# Patient Record
Sex: Female | Born: 1935 | Race: Asian | Hispanic: No | Marital: Married
Health system: Midwestern US, Community
[De-identification: ages and names within clinical notes are randomized; demographics above are authoritative.]

---

## 2017-05-05 ENCOUNTER — Emergency Department: Admit: 2017-05-06 | Payer: MEDICARE

## 2017-05-05 DIAGNOSIS — G8190 Hemiplegia, unspecified affecting unspecified side: Secondary | ICD-10-CM

## 2017-05-05 NOTE — ED Provider Notes (Signed)
HPI Comments: 81 year old female right sided facial droop and right-sided numbness.  Onset of symptoms 4 PM per family.   Patient's visiting the area for a family event and around 4:00 started showing a facial droop.    Patient is a 81 y.o. female presenting with stroke. The history is provided by the patient.   Stroke   This is a new problem. The current episode started 6 to 12 hours ago. The problem has not changed since onset.There was no focality noted. Pertinent negatives include no focal weakness, no slurred speech and no mental status change. There has been no fever. Pertinent negatives include no shortness of breath, no altered mental status, no confusion, no headaches, no choking, no nausea and no bladder incontinence. There were no medications administered prior to arrival. Associated medical issues do not include trauma.        Past Medical History:   Diagnosis Date   ??? Diabetes (HCC)    ??? Hypertension        No past surgical history on file.      No family history on file.    Social History     Social History   ??? Marital status: N/A     Spouse name: N/A   ??? Number of children: N/A   ??? Years of education: N/A     Occupational History   ??? Not on file.     Social History Main Topics   ??? Smoking status: Never Smoker   ??? Smokeless tobacco: Never Used   ??? Alcohol use No   ??? Drug use: Not on file   ??? Sexual activity: Not on file     Other Topics Concern   ??? Not on file     Social History Narrative   ??? No narrative on file         ALLERGIES: Review of patient's allergies indicates no known allergies.    Review of Systems   Constitutional: Negative.  Negative for activity change.   HENT: Negative.    Eyes: Negative.    Respiratory: Negative.  Negative for choking and shortness of breath.    Cardiovascular: Negative.    Gastrointestinal: Negative.  Negative for nausea.   Genitourinary: Negative.  Negative for bladder incontinence.   Musculoskeletal: Negative.    Skin: Negative.     Neurological: Negative for focal weakness and headaches.   Psychiatric/Behavioral: Negative.  Negative for confusion.   All other systems reviewed and are negative.      Vitals:    05/05/17 2316   BP: 162/67   Pulse: 72   Resp: 18   Temp: 97.9 ??F (36.6 ??C)   SpO2: 100%   Weight: 49.9 kg (110 lb)   Height: 5\' 1"  (1.549 m)            Physical Exam   Constitutional: She is oriented to person, place, and time. She appears well-developed and well-nourished. No distress.   HENT:   Head: Normocephalic and atraumatic.   Right Ear: External ear normal.   Left Ear: External ear normal.   Nose: Nose normal.   Mouth/Throat: Oropharynx is clear and moist. No oropharyngeal exudate.   Eyes: Conjunctivae and EOM are normal. Pupils are equal, round, and reactive to light. Right eye exhibits no discharge. Left eye exhibits no discharge. No scleral icterus.   Neck: Normal range of motion. Neck supple. No JVD present. No tracheal deviation present.   Cardiovascular: Normal rate, regular rhythm and intact distal pulses.  Pulmonary/Chest: Effort normal and breath sounds normal. No stridor. No respiratory distress. She has no wheezes. She exhibits no tenderness.   Abdominal: Soft. Bowel sounds are normal. She exhibits no distension and no mass. There is no tenderness.   Musculoskeletal: Normal range of motion. She exhibits no edema or tenderness.   Neurological: She is alert and oriented to person, place, and time. A cranial nerve deficit and sensory deficit is present. She exhibits abnormal muscle tone.   Skin: Skin is warm and dry. No rash noted. She is not diaphoretic. No erythema. No pallor.   Psychiatric: She has a normal mood and affect. Her behavior is normal. Thought content normal.   Nursing note and vitals reviewed.       MDM  Number of Diagnoses or Management Options  Hemiparesis, unspecified hemiparesis etiology, unspecified laterality [facility]):   Diagnosis management comments: Patient evaluated byTele-neurology  And  felt to be back to her baseline showed no signs of neuro deficits.  Long discussion with the family and they do not want further admission or testing they would take her back to [facility] and get testing done there.  Neurologist and the family are in agreement.       Amount and/or Complexity of Data Reviewed  Clinical lab tests: ordered and reviewed  Tests in the radiology section of CPT??: reviewed and ordered  Tests in the medicine section of CPT??: reviewed and ordered          ED Course       Procedures

## 2017-05-05 NOTE — ED Triage Notes (Signed)
Facial dropping last know normal was 1600

## 2017-05-06 ENCOUNTER — Inpatient Hospital Stay
Admit: 2017-05-06 | Discharge: 2017-05-06 | Disposition: A | Discharge status: Home / Self Care | Payer: MEDICARE | Attending: Emergency Medicine

## 2017-05-06 LAB — METABOLIC PANEL, COMPREHENSIVE
A-G Ratio: 0.9 — ABNORMAL LOW (ref 1.2–3.5)
ALT (SGPT): 16 U/L (ref 12–65)
AST (SGOT): 17 U/L (ref 15–37)
Albumin: 3.4 g/dL (ref 3.2–4.6)
Alk. phosphatase: 53 U/L (ref 50–136)
Anion gap: 9 mmol/L (ref 7–16)
BUN: 20 MG/DL (ref 8–23)
Bilirubin, total: 0.3 MG/DL (ref 0.2–1.1)
CO2: 30 mmol/L (ref 21–32)
Calcium: 9.1 MG/DL (ref 8.3–10.4)
Chloride: 104 mmol/L (ref 98–107)
Creatinine: 0.97 MG/DL (ref 0.6–1.0)
GFR est AA: >60 mL/min/{1.73_m2} (ref >60)
GFR est non-AA: 59 mL/min/{1.73_m2} — ABNORMAL LOW (ref >60)
Globulin: 3.6 g/dL — ABNORMAL HIGH (ref 2.3–3.5)
Glucose: 183 mg/dL — ABNORMAL HIGH (ref 65–100)
Potassium: 4.4 mmol/L (ref 3.5–5.1)
Protein, total: 7 g/dL (ref 6.3–8.2)
Sodium: 143 mmol/L (ref 136–145)

## 2017-05-06 LAB — CBC WITH AUTOMATED DIFF
ABS. BASOPHILS: 0 10*3/uL (ref 0.0–0.2)
ABS. EOSINOPHILS: 0.1 10*3/uL (ref 0.0–0.8)
ABS. IMM. GRANS.: 0 10*3/uL (ref 0.0–0.5)
ABS. LYMPHOCYTES: 1.3 10*3/uL (ref 0.5–4.6)
ABS. MONOCYTES: 0.5 10*3/uL (ref 0.1–1.3)
ABS. NEUTROPHILS: 3 10*3/uL (ref 1.7–8.2)
BASOPHILS: 0 % (ref 0.0–2.0)
EOSINOPHILS: 2 % (ref 0.5–7.8)
HCT: 41.4 % (ref 35.8–46.3)
HGB: 13.6 g/dL (ref 11.7–15.4)
IMMATURE GRANULOCYTES: 0 % (ref 0.0–5.0)
LYMPHOCYTES: 27 % (ref 13–44)
MCH: 27.1 PG (ref 26.1–32.9)
MCHC: 32.9 g/dL (ref 31.4–35.0)
MCV: 82.5 FL (ref 79.6–97.8)
MONOCYTES: 10 % (ref 4.0–12.0)
NEUTROPHILS: 61 % (ref 43–78)
PLATELET: 218 10*3/uL (ref 150–450)
RBC: 5.02 M/uL (ref 4.05–5.25)
RDW: 15.3 % — ABNORMAL HIGH (ref 11.9–14.6)
WBC: 4.9 10*3/uL (ref 4.3–11.1)

## 2017-05-06 LAB — MAGNESIUM: Magnesium: 2 mg/dL (ref 1.8–2.4)

## 2017-05-06 LAB — SED RATE, AUTOMATED: Sed rate, automated: 15 mm/hr (ref 0–30)

## 2017-05-06 LAB — POC LACTIC ACID: Lactic Acid (POC): 1.3 mmol/L (ref 0.5–1.9)

## 2017-05-06 NOTE — ED Notes (Signed)
I have reviewed discharge instructions with the daughter.  The daughter verbalized understanding.    Patient left ED via Discharge Method: ambulatory to Home with family   Opportunity for questions and clarification provided.       Patient given 0 scripts.         To continue your aftercare when you leave the hospital, you may receive an automated call from our care team to check in on how you are doing.  This is a free service and part of our promise to provide the best care and service to meet your aftercare needs.??? If you have questions, or wish to unsubscribe from this service please call [phone].  Thank you for Choosing our [facility].

## 2017-05-06 NOTE — ED Notes (Signed)
Redraw green top sent to lab. POC lactic running.

## 2017-05-07 LAB — EKG 12-LEAD
Atrial Rate: 62 {beats}/min
P Axis: 79 degrees
P-R Interval: 140 ms
Q-T Interval: 404 ms
QRS Duration: 72 ms
QTc Calculation (Bazett): 410 ms
R Axis: 61 degrees
T Axis: 75 degrees
Ventricular Rate: 62 {beats}/min

## 2017-05-07 LAB — EKG, 12 LEAD, INITIAL
Atrial Rate: 62 {beats}/min
Calculated P Axis: 79 degrees
Calculated R Axis: 61 degrees
Calculated T Axis: 75 degrees
P-R Interval: 140 ms
Q-T Interval: 404 ms
QRS Duration: 72 ms
QTC Calculation (Bezet): 410 ms
Ventricular Rate: 62 {beats}/min

## 2019-02-10 IMAGING — CT CT HEAD WO CONTRAST
1 series · 16 of 30 positions shown, 20 images · non-contrast
Comparison: none

Noncontrast CT head.
INDICATION: persistente headache x 3 days, Byars/Tr Nancy

[Series 2: head stnd · axial · 0.42mm/px · z∈[-2,+145]mm · 16 of 32 slices shown, 20 images]
[im 2/32  brain]
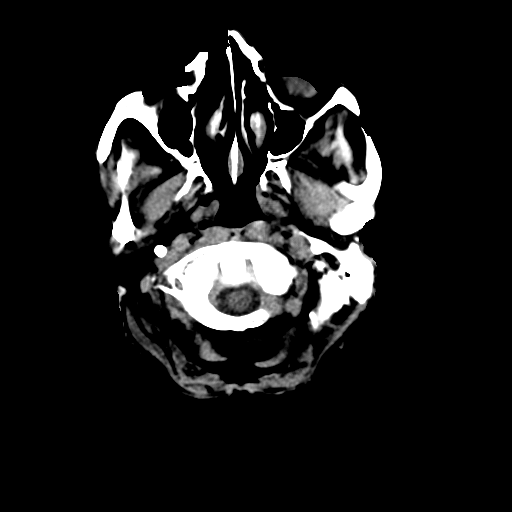
[im 2/32  bone]
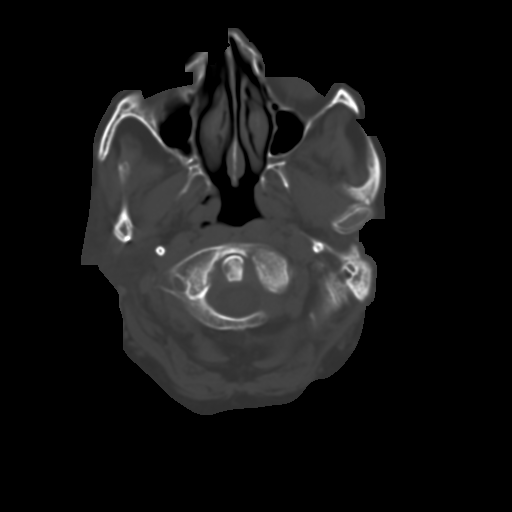
[im 4/32  brain]
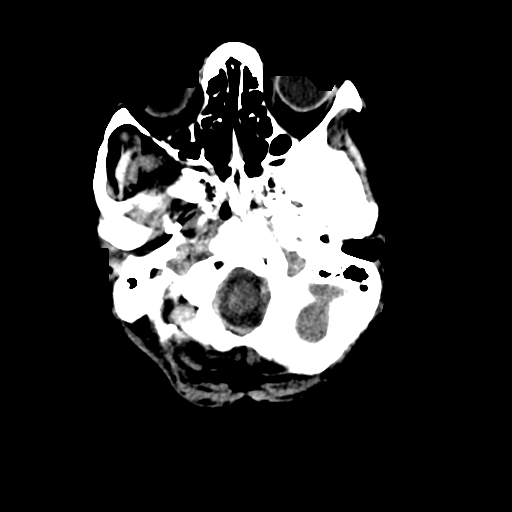
[im 6/32  brain]
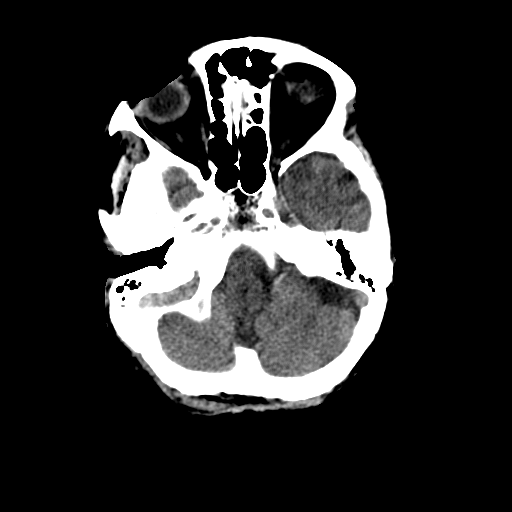
[im 8/32  brain]
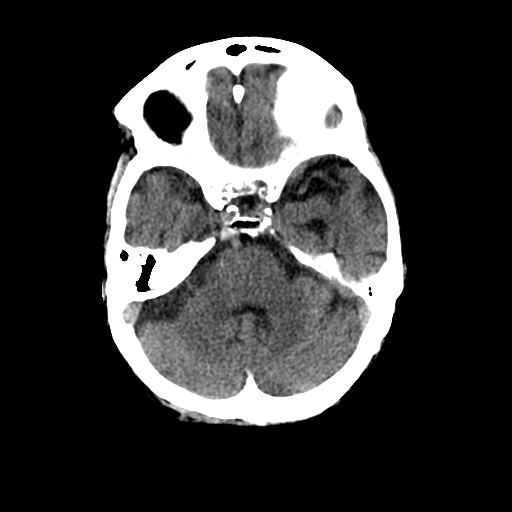
[im 9/32  brain]
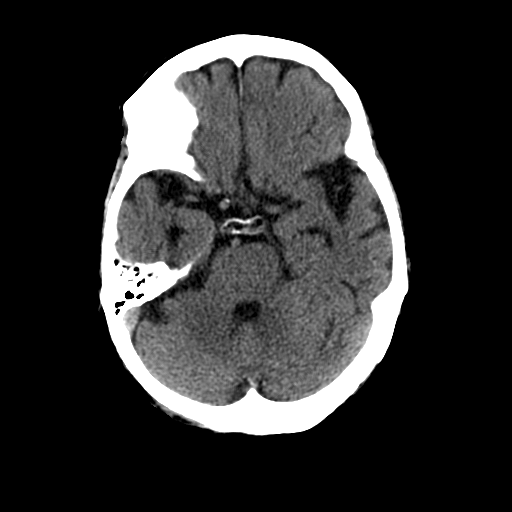
[im 9/32  bone]
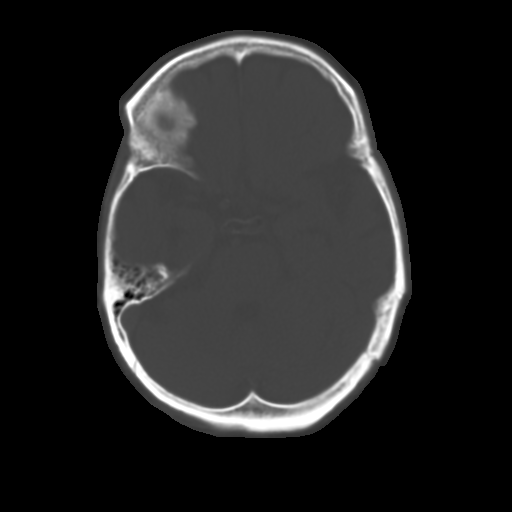
[im 11/32  brain]
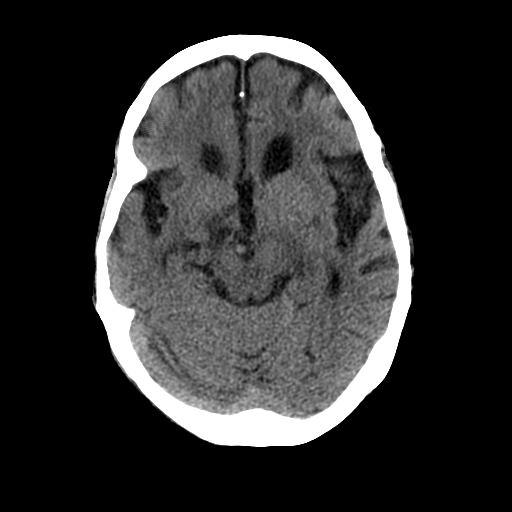
[im 13/32  brain]
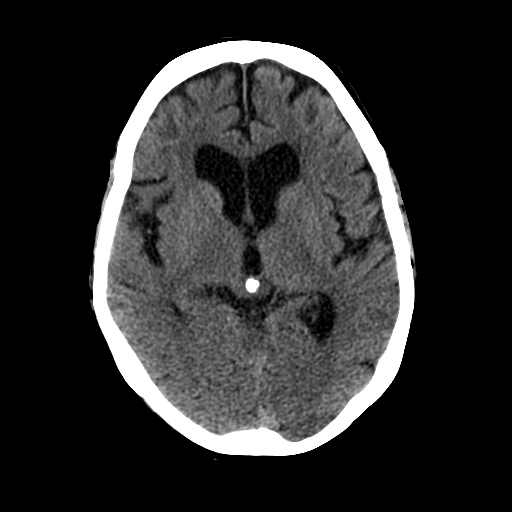
[im 15/32  brain]
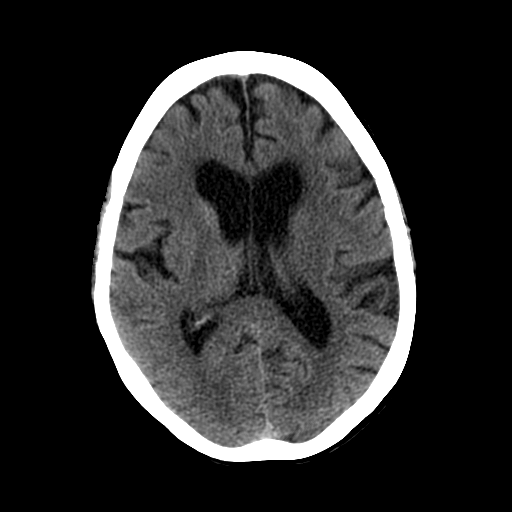
[im 17/32  brain]
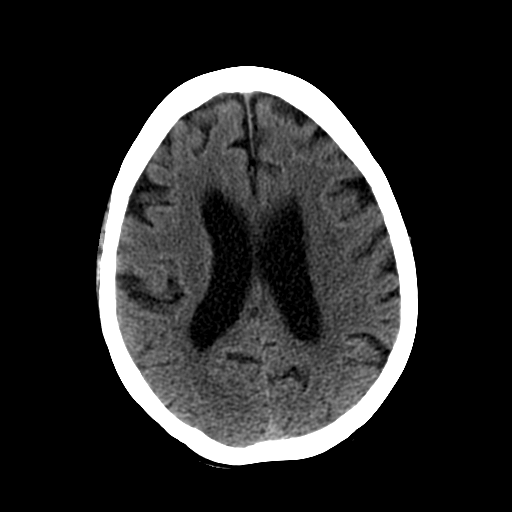
[im 17/32  bone]
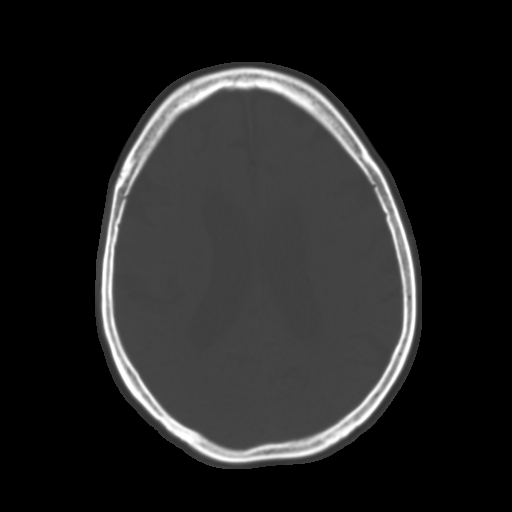
[im 19/32  brain]
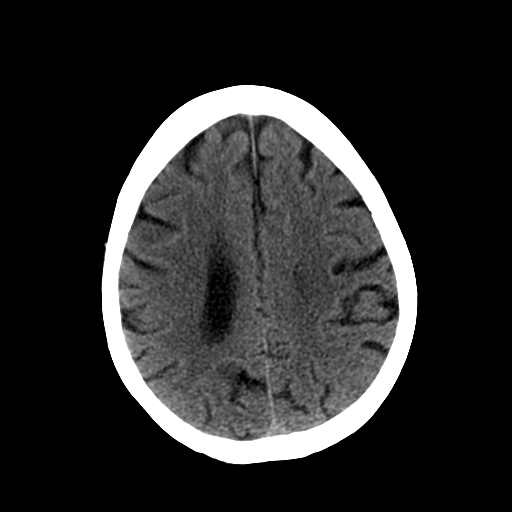
[im 21/32  brain]
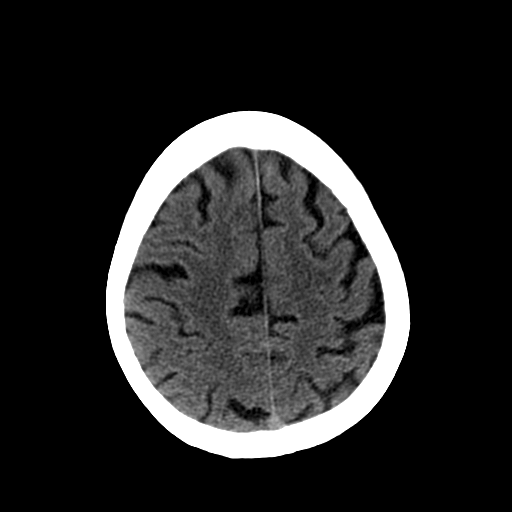
[im 23/32  brain]
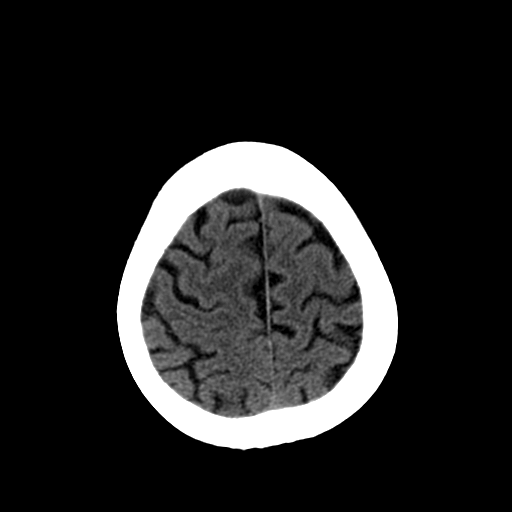
[im 24/32  brain]
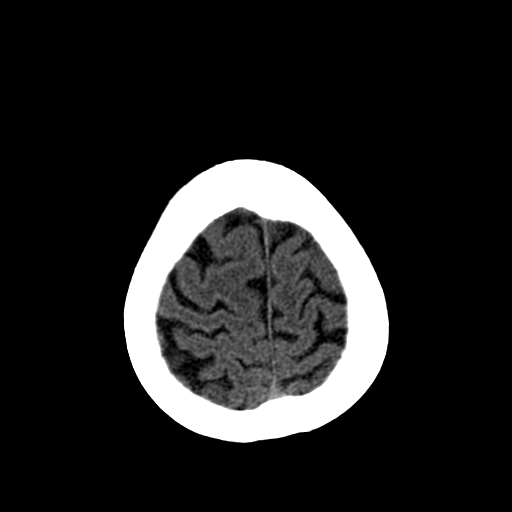
[im 24/32  bone]
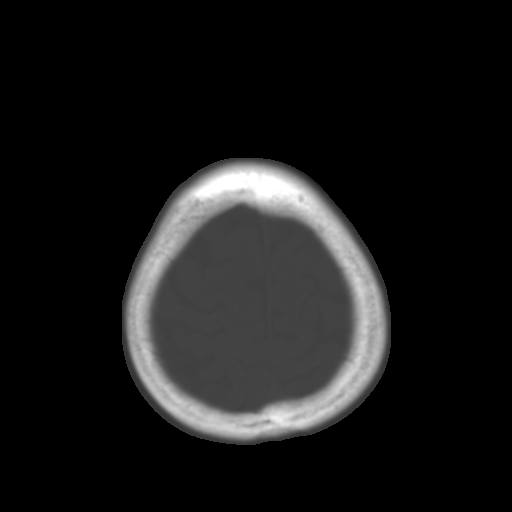
[im 26/32  brain]
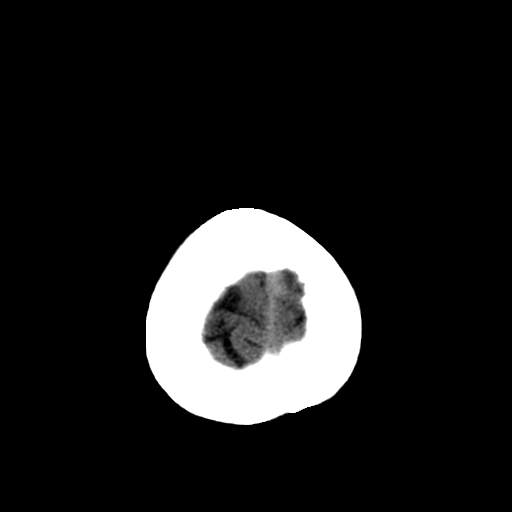
[im 28/32  brain]
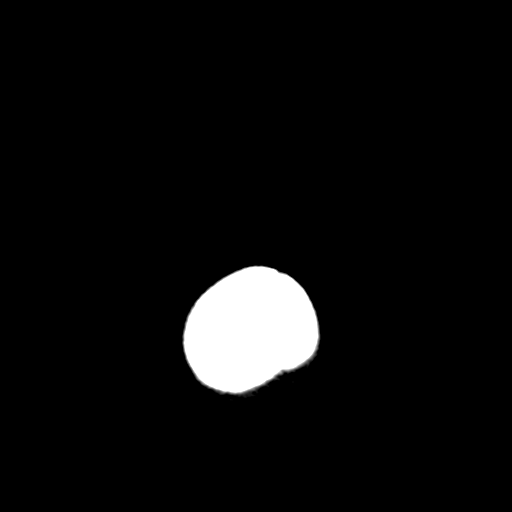
[im 30/32  brain]
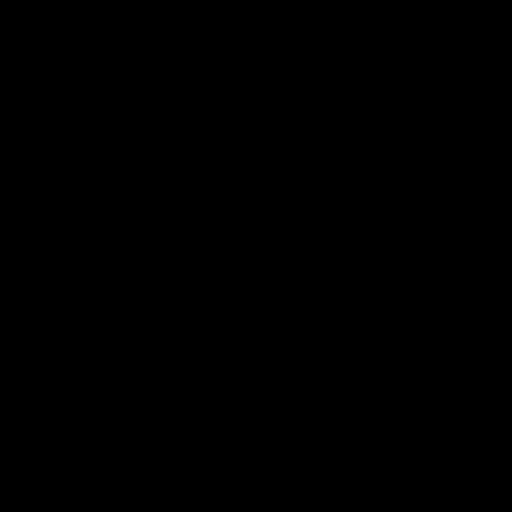

[16 of 30 positions shown; findings below may reference images not displayed]

FINDINGS: The bony calvarium and skull base are intact.  The visualized paranasal sinuses and mastoid air cells are clear.
No intracranial hemorrhage, mass-effect, or midline shift.  The ventricles, sulci, and cisterns are moderately prominent, consistent with age-related atrophic volume loss.  Scattered areas of decreased attenuation in the periventricular white matter are compatible with chronic small vessel ischemic changes.  No abnormal extra-axial fluid collections.  .
IMPRESSION: 
IMPRESSION: 1.  No evidence of acute intracranial hemorrhage or mass-effect.
2.  Appearance of chronic ischemic small vessel changes and volume loss as described..
Location 12

## 2021-01-28 IMAGING — DX XR LUMBAR SPINE 2-3 VIEWS
1 series · 3 of 3 positions shown · non-contrast
Comparison: none

Joint pain, hip; lower back pain; Pt with left lumbar and hip pain x4 weeks
Exam: Lumbar spine 3 views
Pedicles intact. Spinous processes in alignment. SI joints unremarkable. Arcuate lines are grossly intact without the sacrum. Moderate loss of disc space L5-S1. Alignment is within normal limits. Nonobstructive gas pattern.

[Series 3092: AP · U · 0.19mm/px · 3 of 3 slices shown]
[im 1/3]
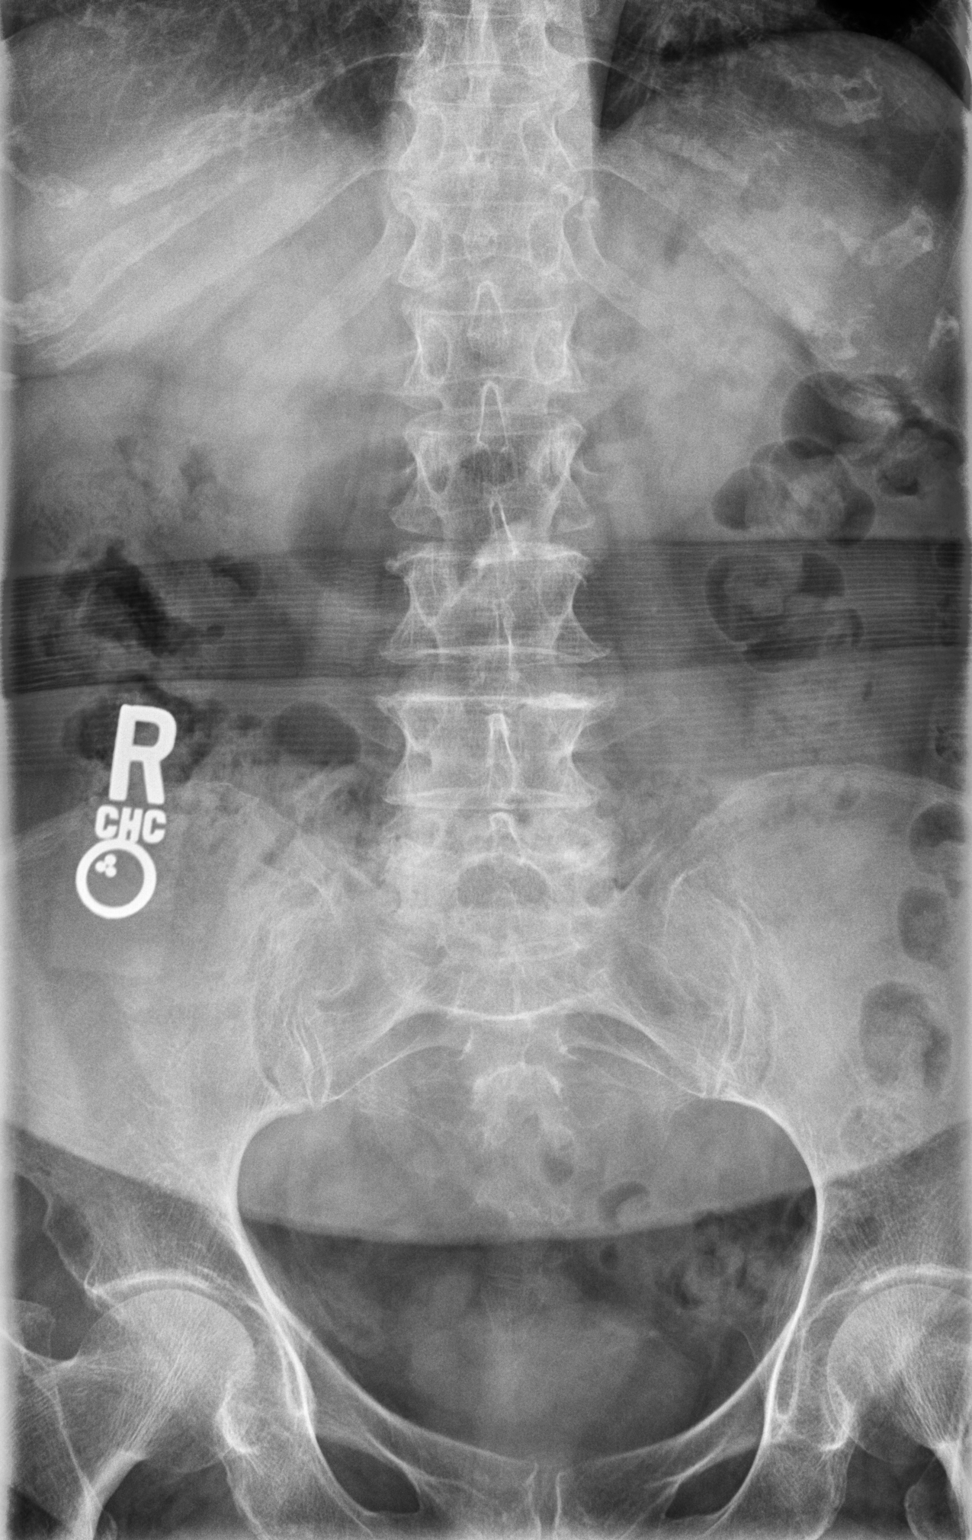
[im 2/3]
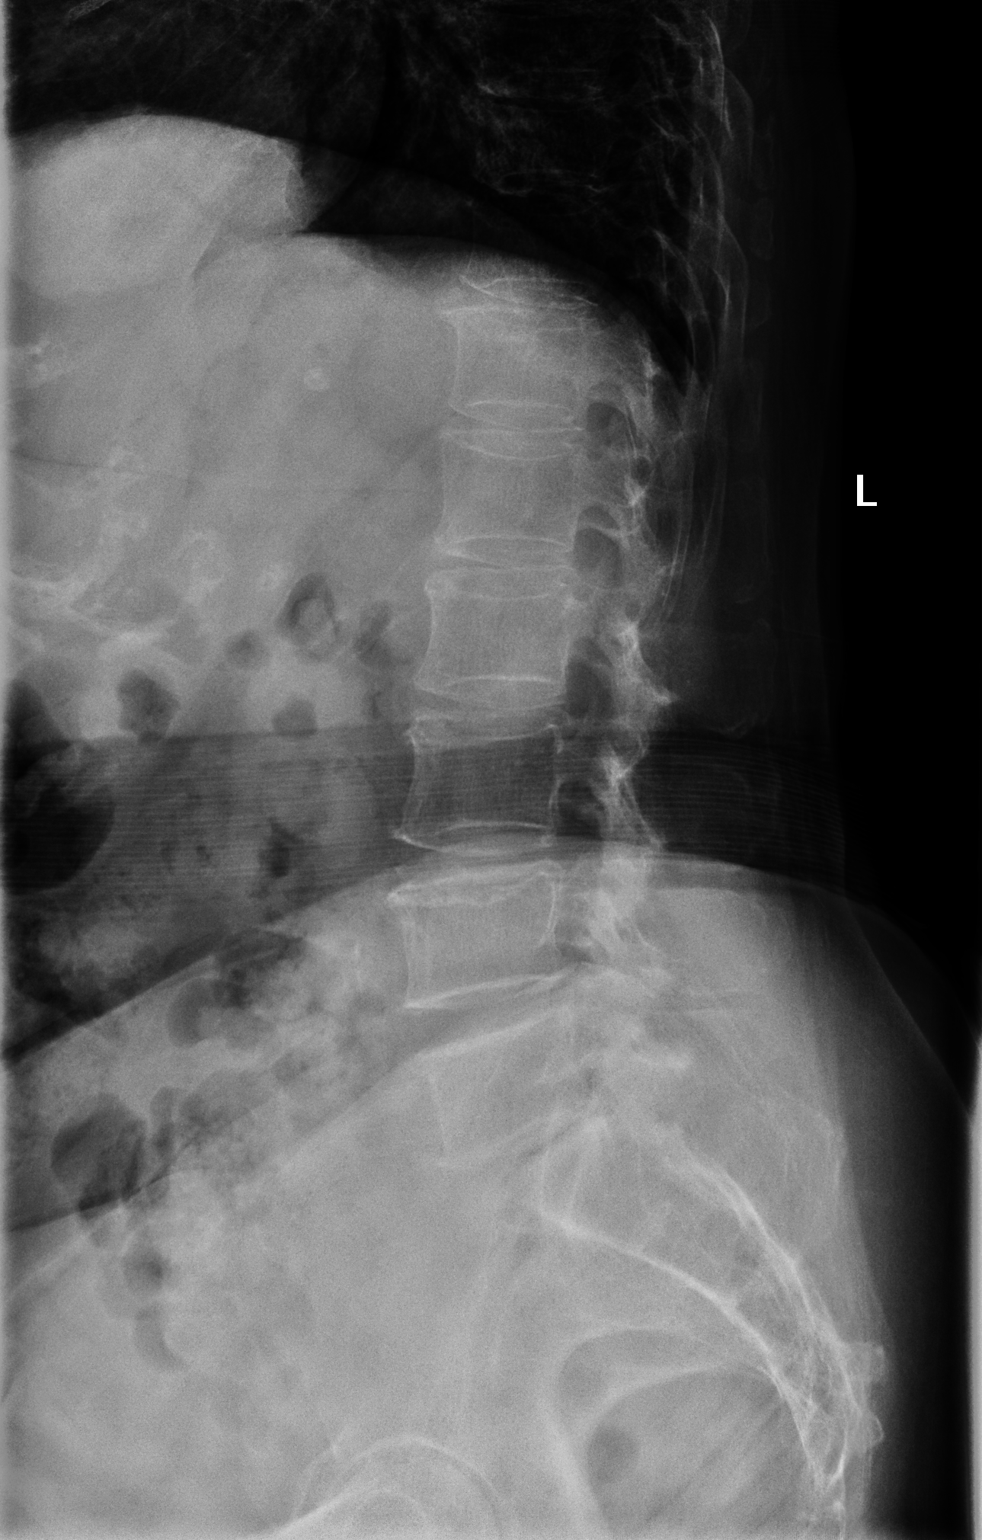
[im 3/3]
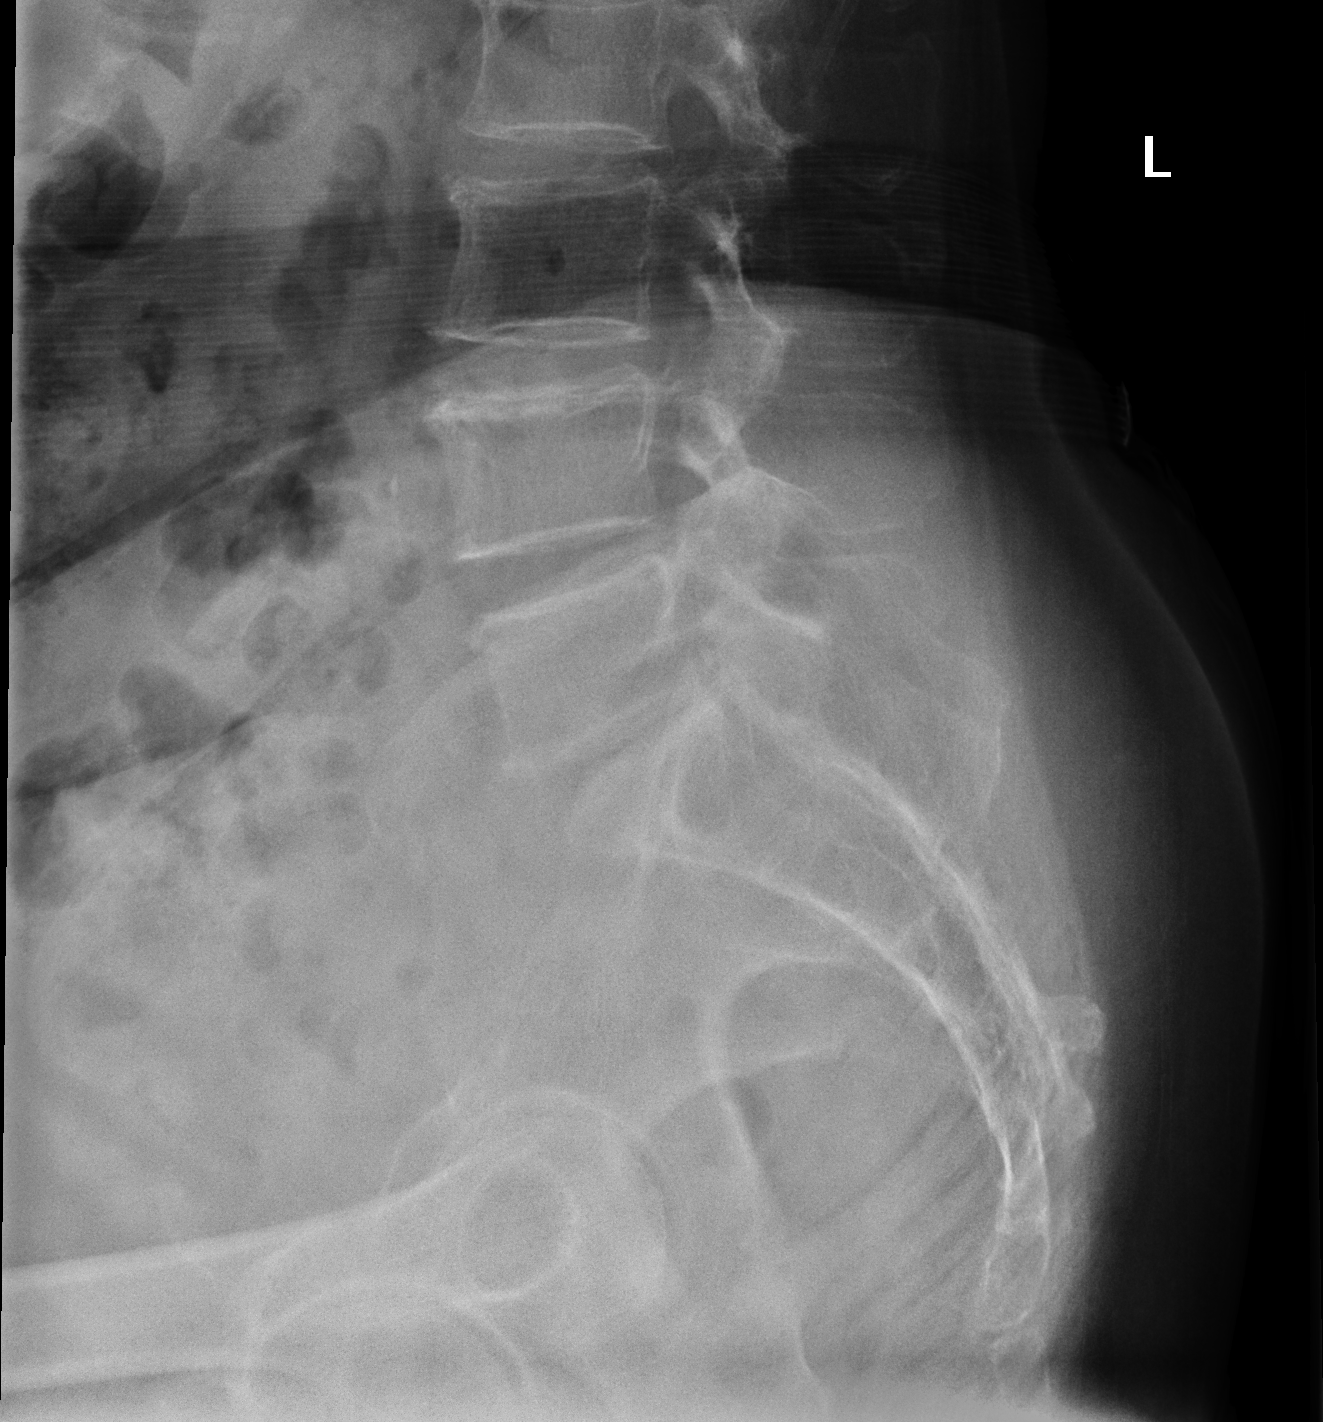

[3 of 3 positions shown; findings below may reference images not displayed]

IMPRESSION: Degenerative changes lower lumbar spine. No acute bony abnormality.

## 2021-06-14 IMAGING — US US LOW EXT ARTERIES BILAT
1 series · 14 of 16 positions shown · non-contrast
Comparison: none

FINAL REPORT:
INDICATION: Right toe pain.
PROCEDURE: Grayscale, color-flow, and duplex images of the bilateral lower extremity arterial systems.

[Series 1: us low ext arteries bilat · 14 of 54 slices shown]
[im 1/54]
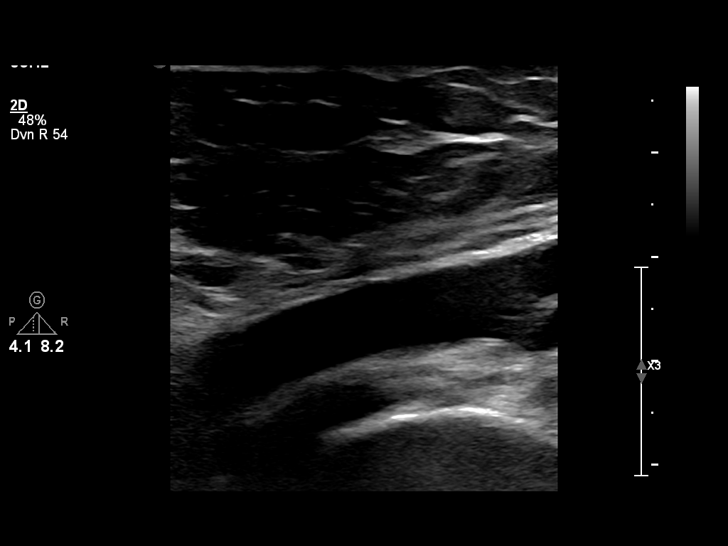
[im 4/54]
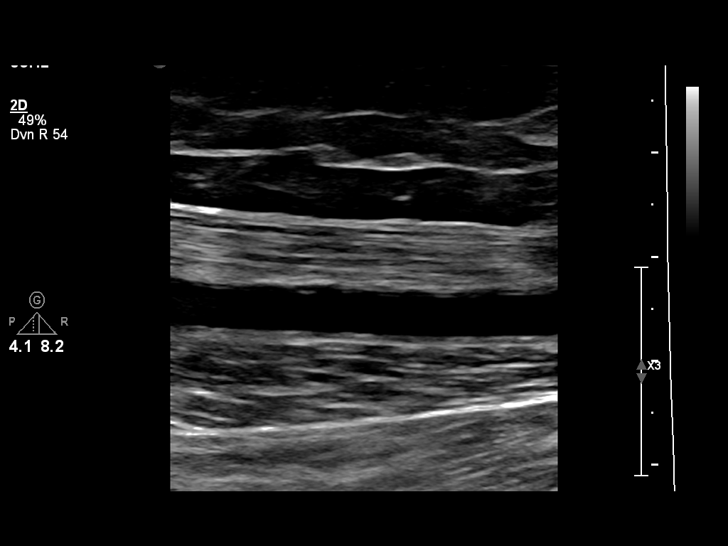
[im 8/54]
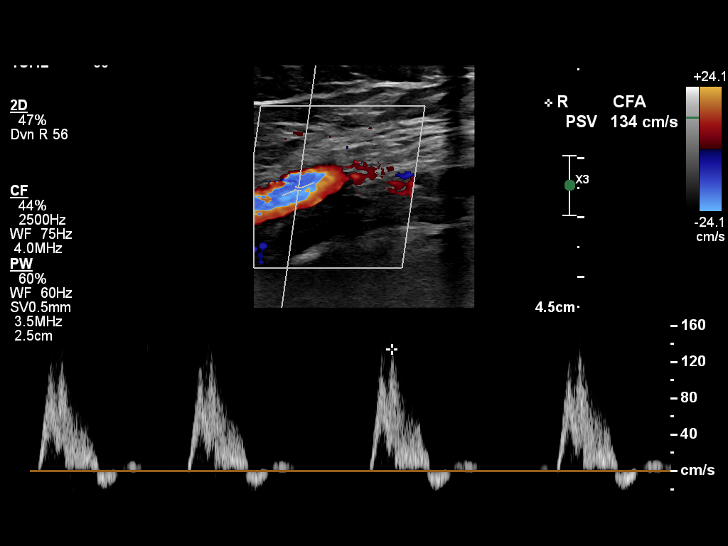
[im 15/54]
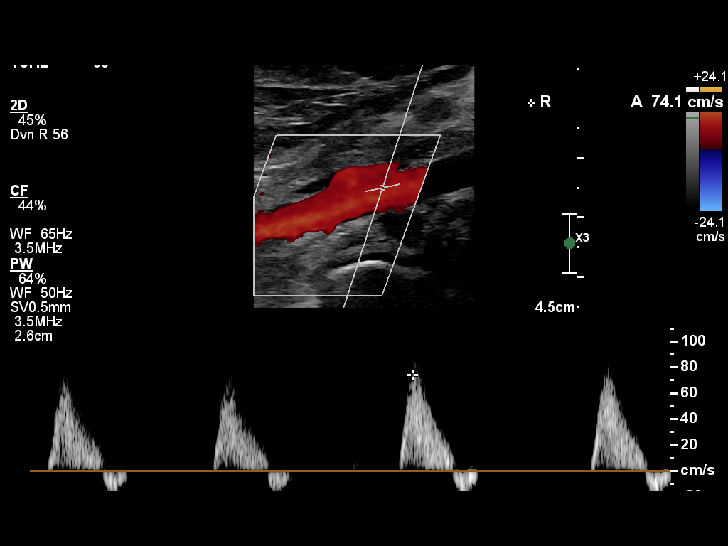
[im 18/54]
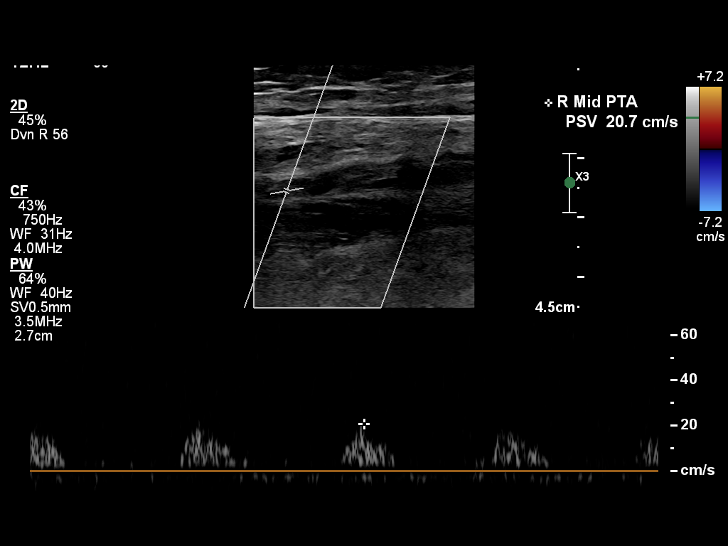
[im 22/54]
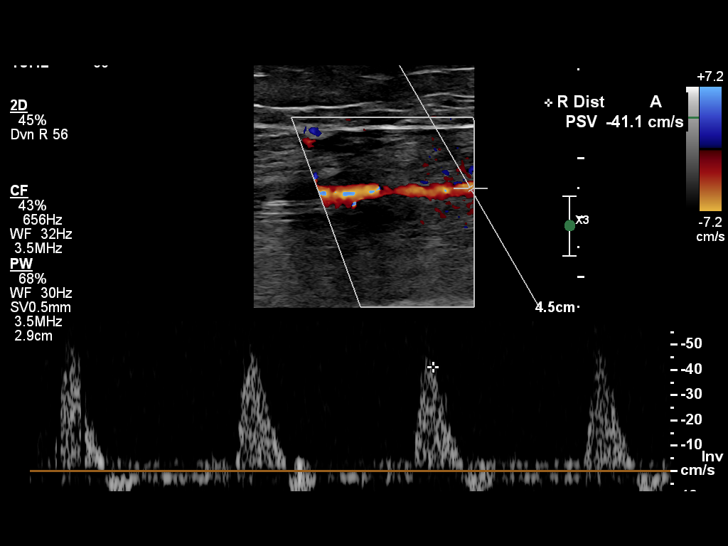
[im 25/54]
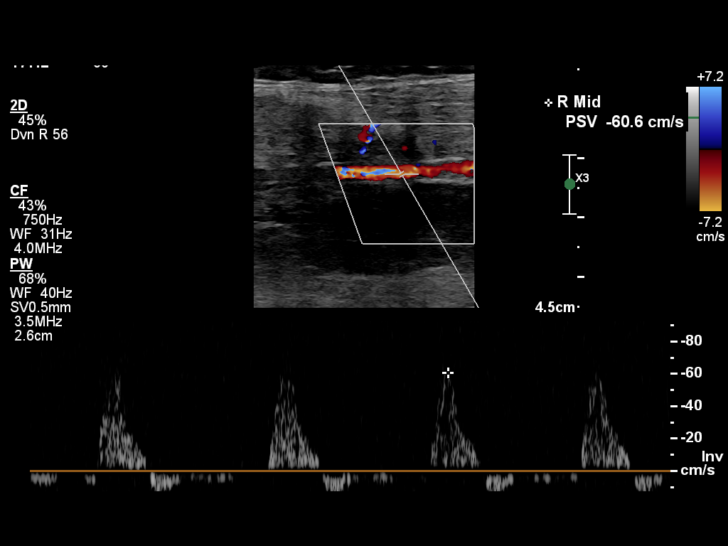
[im 29/54]
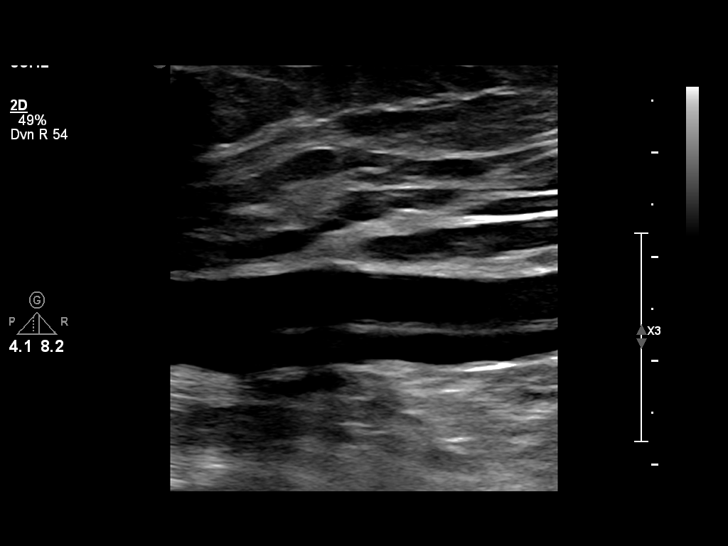
[im 32/54]
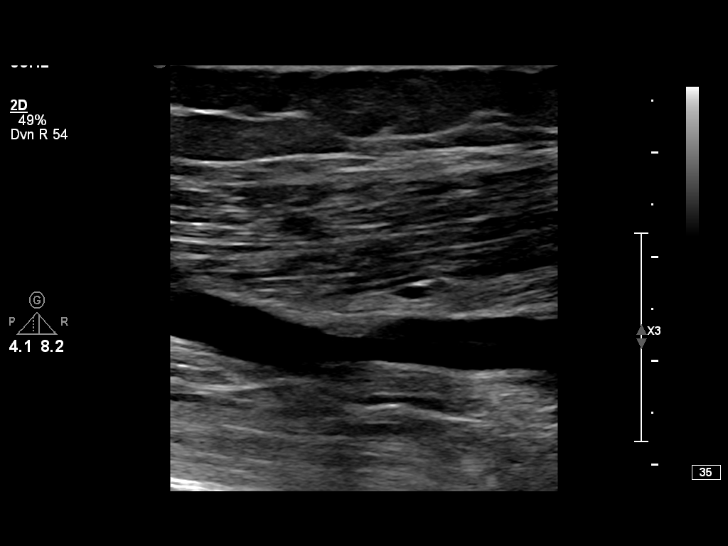
[im 36/54]
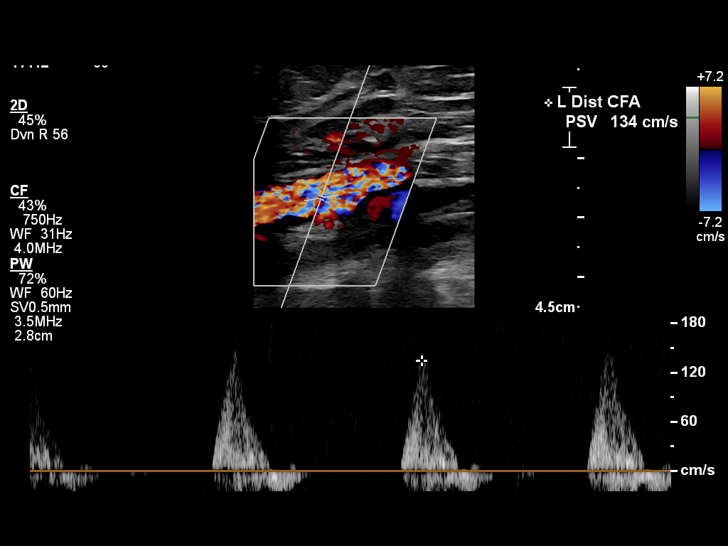
[im 43/54]
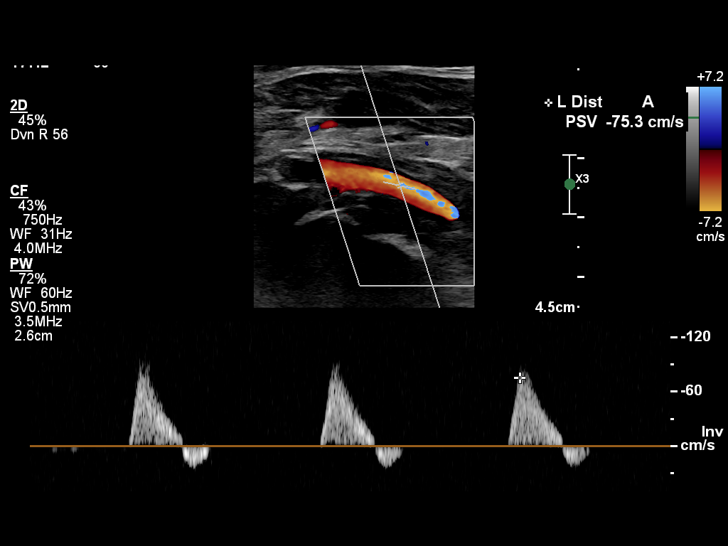
[im 46/54]
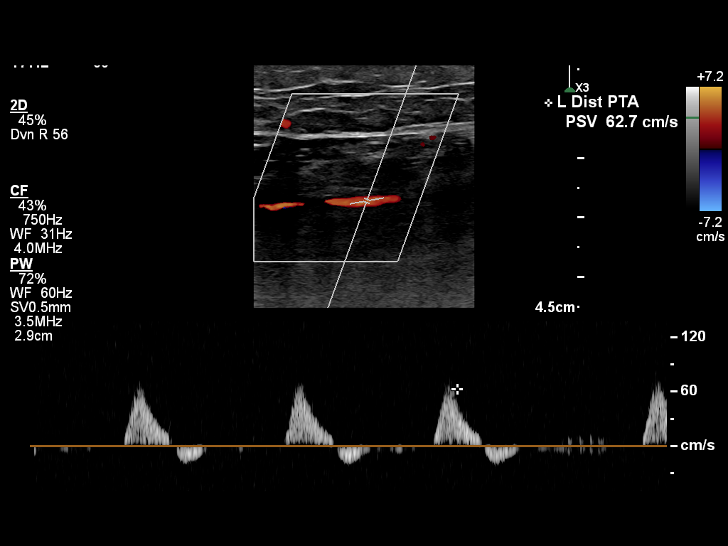
[im 50/54]
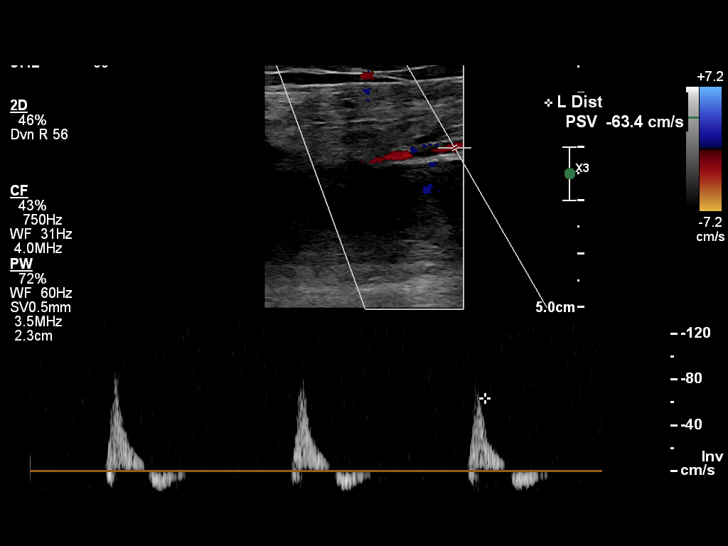
[im 54/54]
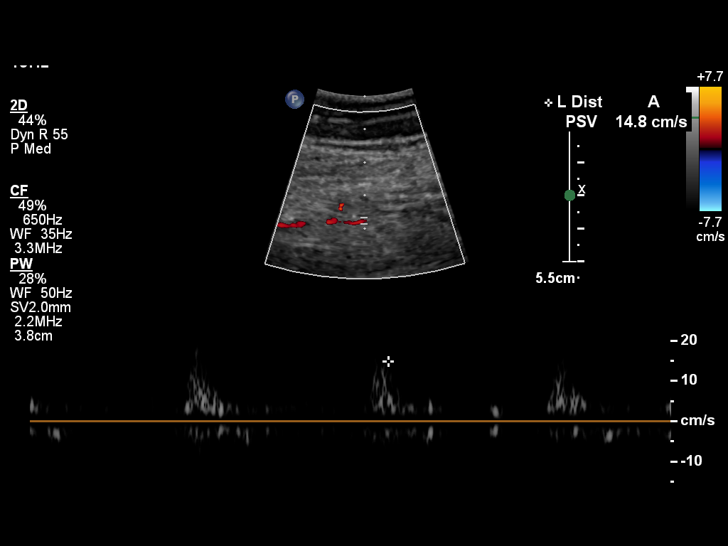

[14 of 16 positions shown; findings below may reference images not displayed]

FINDINGS: Right lower extremity triphasic waveforms and normal peak systolic velocities are seen throughout with the exception of a tardus parvus waveform and low peak systolic velocity of 10 cm/s in the dorsalis pedis artery. Normal peak systolic velocities and waveforms are seen throughout the length of the left lower extremity arterial system with the exception of low peak systolic velocities 14-18 cm/s through the length of the peroneal artery. Left dorsalis pedis artery has a normal waveform and normal peak systolic velocity of 27 cm/s.
IMPRESSION: 1. No evidence of arterial occlusion.
2. Distal arterial disease in the right lower extremity proximal to the dorsalis pedis artery.
3. Normal left lower extremity arterial examination with the exception of mild decreased velocity in the peroneal artery. Normal arterial exam at the level of the left dorsalis pedis artery.

## 2021-09-18 IMAGING — CT CT ABDOMEN PELVIS WITH CONTRAST
5 of 12 series · 13 of 46 positions shown, 19 images · IV contrast (agent unspecified)
Comparison: None available.

Diffuse abdominal pain
FINAL REPORT:
INDICATION: diffuse abd pain, low back pain
EXAM: CT of the abdomen and pelvis with IV Contrast
All CT scans at this facility use iterative reconstruction technique, dose modulation and/or weight based dosing when appropriate to reduce radiation dose to as low as reasonably achievable.

[Series 2: abdomen/pel with · axial · 0.70mm/px · z∈[-375,-125]mm · 3 of 201 slices shown, 7 images]
[im 51/201  soft-tissue]
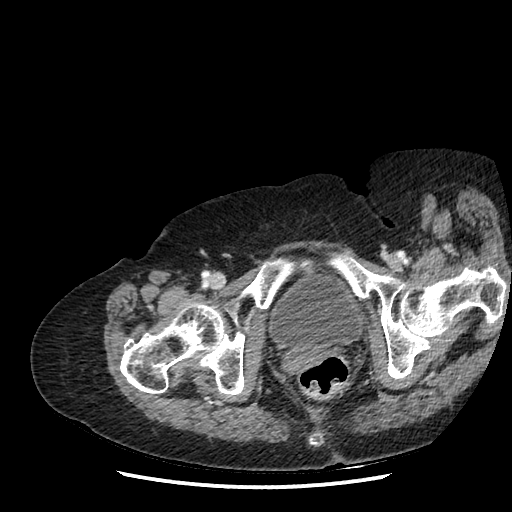
[im 51/201  lung]
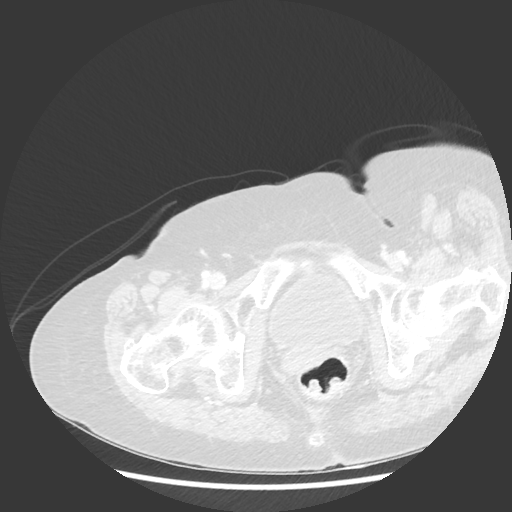
[im 51/201  bone]
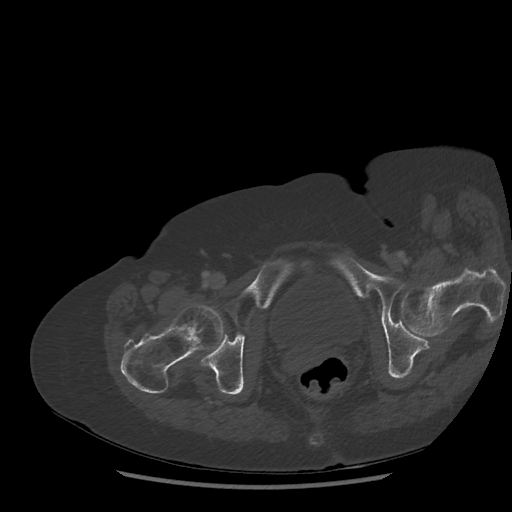
[im 101/201  soft-tissue]
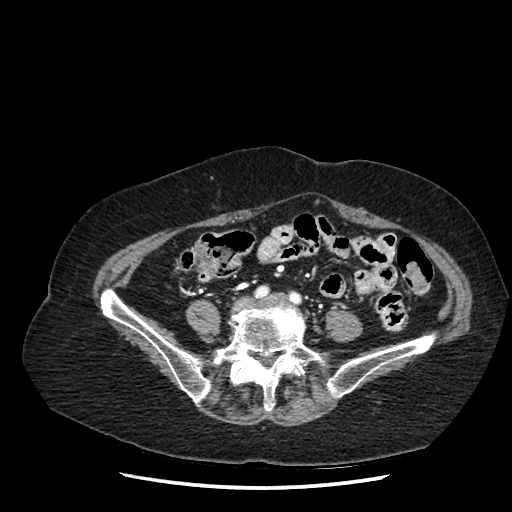
[im 101/201  lung]
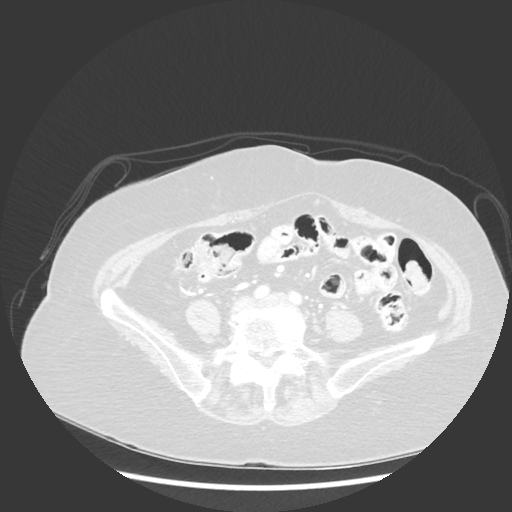
[im 151/201  soft-tissue]
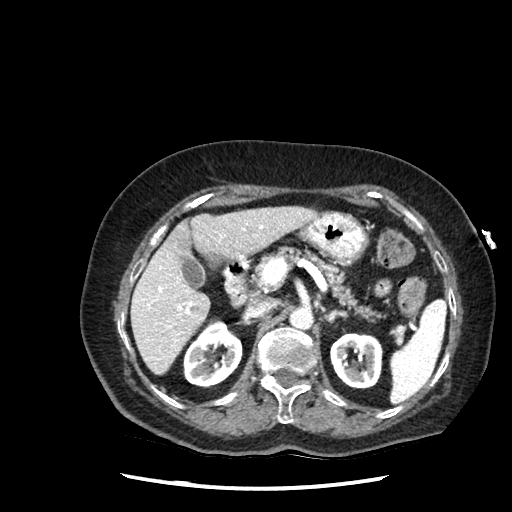
[im 151/201  lung]
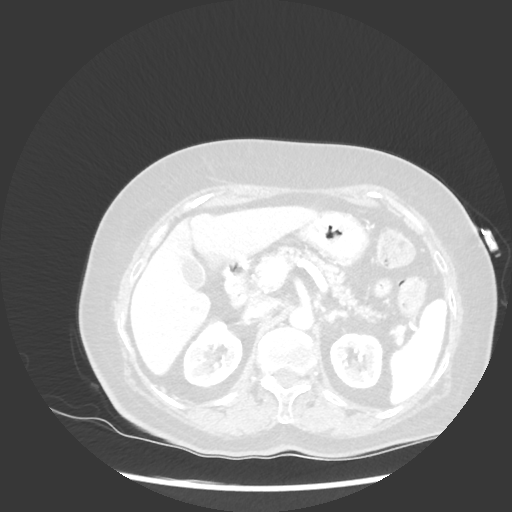

[Series 312: cor 3 · coronal · 0.93mm/px · 4 of 174 slices shown]
[im 35/174  soft-tissue]
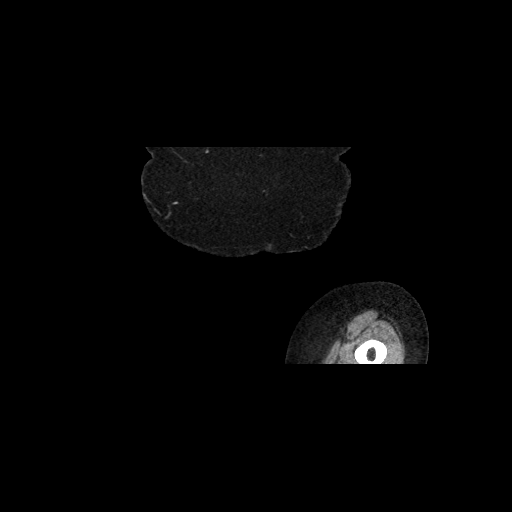
[im 70/174  soft-tissue]
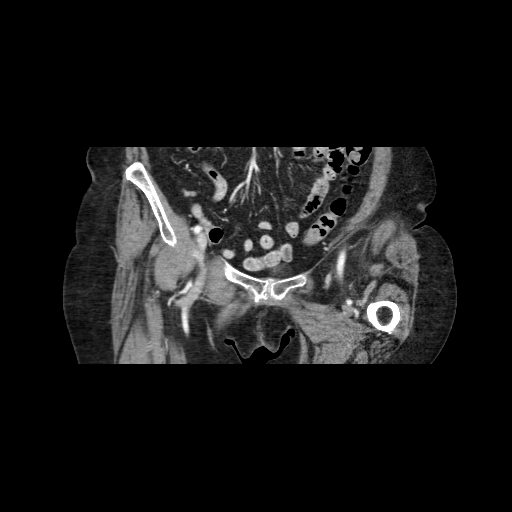
[im 104/174  soft-tissue]
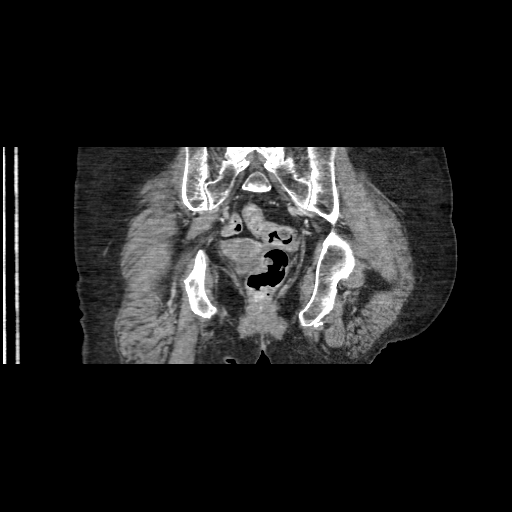
[im 139/174  soft-tissue]
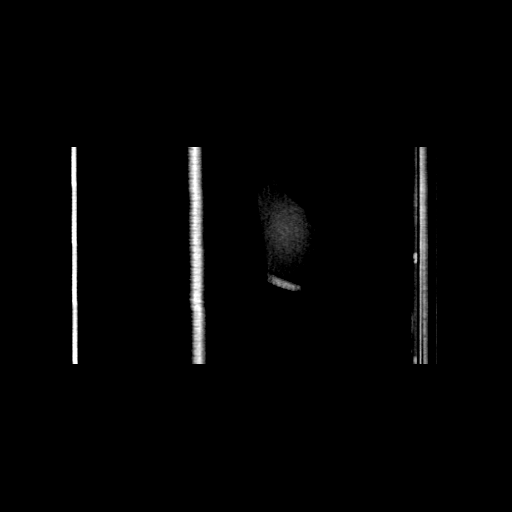

[Series 313: sag 3 · sagittal · 0.93mm/px · 2 of 181 slices shown]
[im 37/181  soft-tissue]
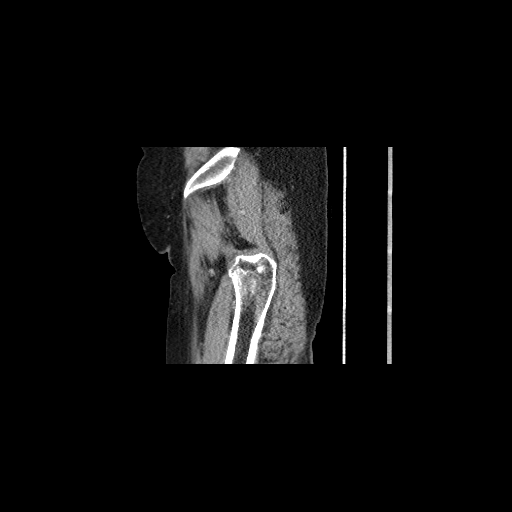
[im 73/181  soft-tissue]
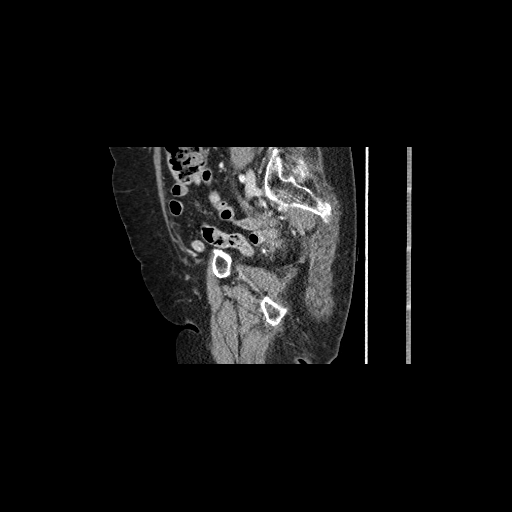

[Series 601: cor standard 2x2 · coronal · 0.70mm/px · 1 of 119 slices shown, 2 images]
[im 40/119  soft-tissue]
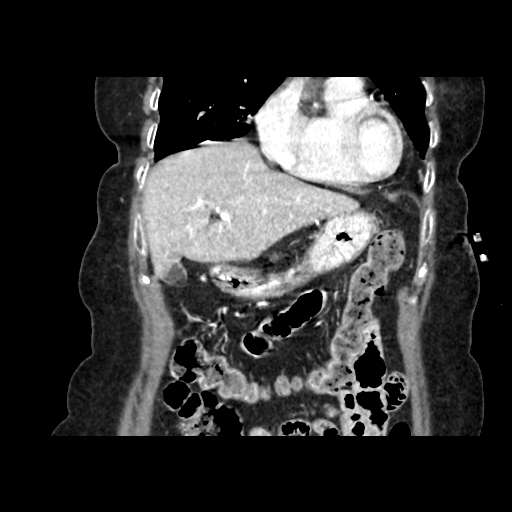
[im 40/119  bone]
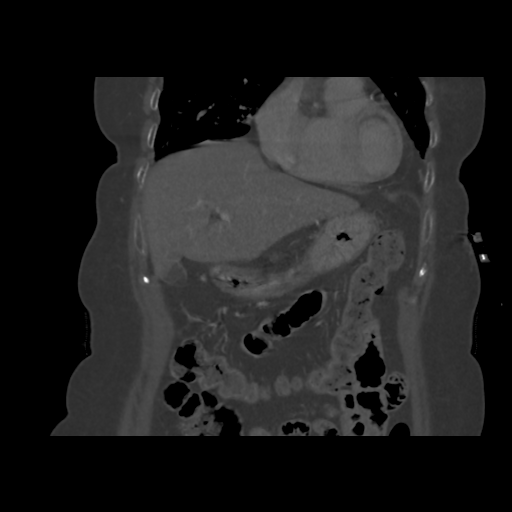

[Series 602: sag standard 2x2 · sagittal · 0.70mm/px · 3 of 173 slices shown, 4 images]
[im 58/173  soft-tissue]
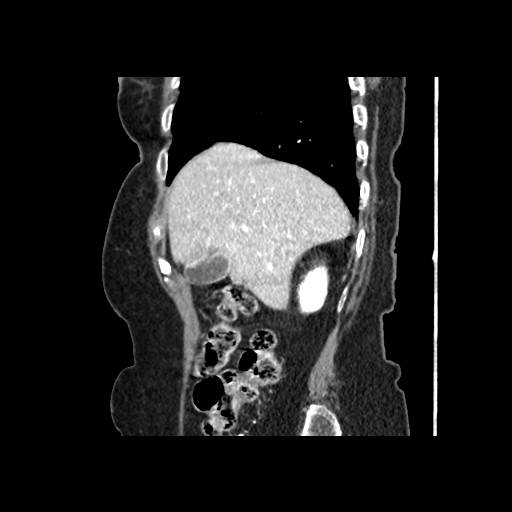
[im 77/173  soft-tissue]
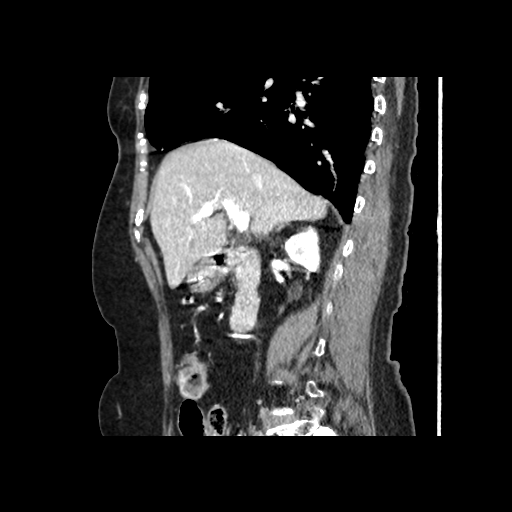
[im 77/173  bone]
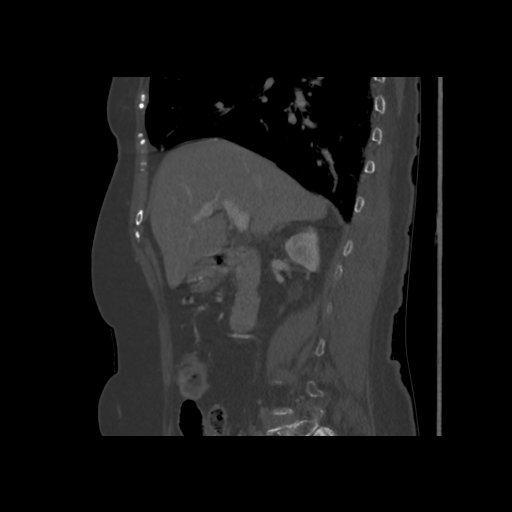
[im 96/173  soft-tissue]
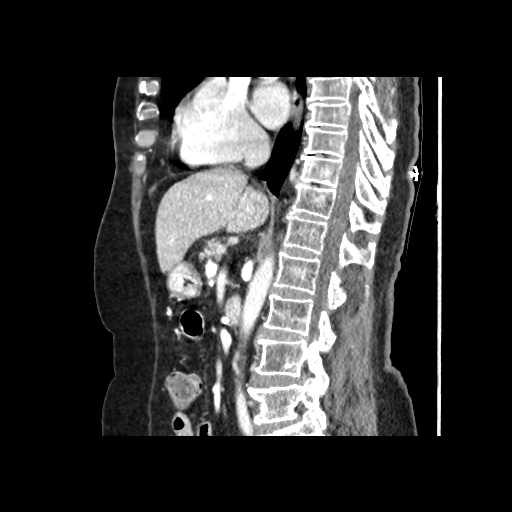

[13 of 46 positions shown; findings below may reference images not displayed]

FINDINGS: Lung bases: Unremarkable.
Gallbladder and biliary tree: Unremarkable.
Liver: Unremarkable.
Adrenal glands: Unremarkable.
Kidneys and ureters: Unremarkable.
Pancreas: Unremarkable.
Spleen: Unremarkable.
Vessels: The abdominal aorta is nonaneurysmal.
Peritoneum: No pneumoperitoneum or free intraperitoneal fluid collections.
Bowel: Unremarkable.    The appendix is unremarkable.
Bladder: Unremarkable.
Reproductive organs: The uterus is unremarkable. No adnexal mass.
Lymph nodes
Retroperitoneal: Unremarkable.
Mesenteric: Unremarkable.
Pelvic: Unremarkable.
Abdominal wall: Unremarkable.
Bones: Unremarkable.
IMPRESSION: 
IMPRESSION: 1. No significant abnormality

## 2022-02-01 IMAGING — CR XR CHEST 1 VIEW
1 series · 1 of 1 positions shown · non-contrast
Comparison: 12/21/2017

FINAL REPORT:
HISTORY: Transient ischemic attack
Number of views:1

[AP]
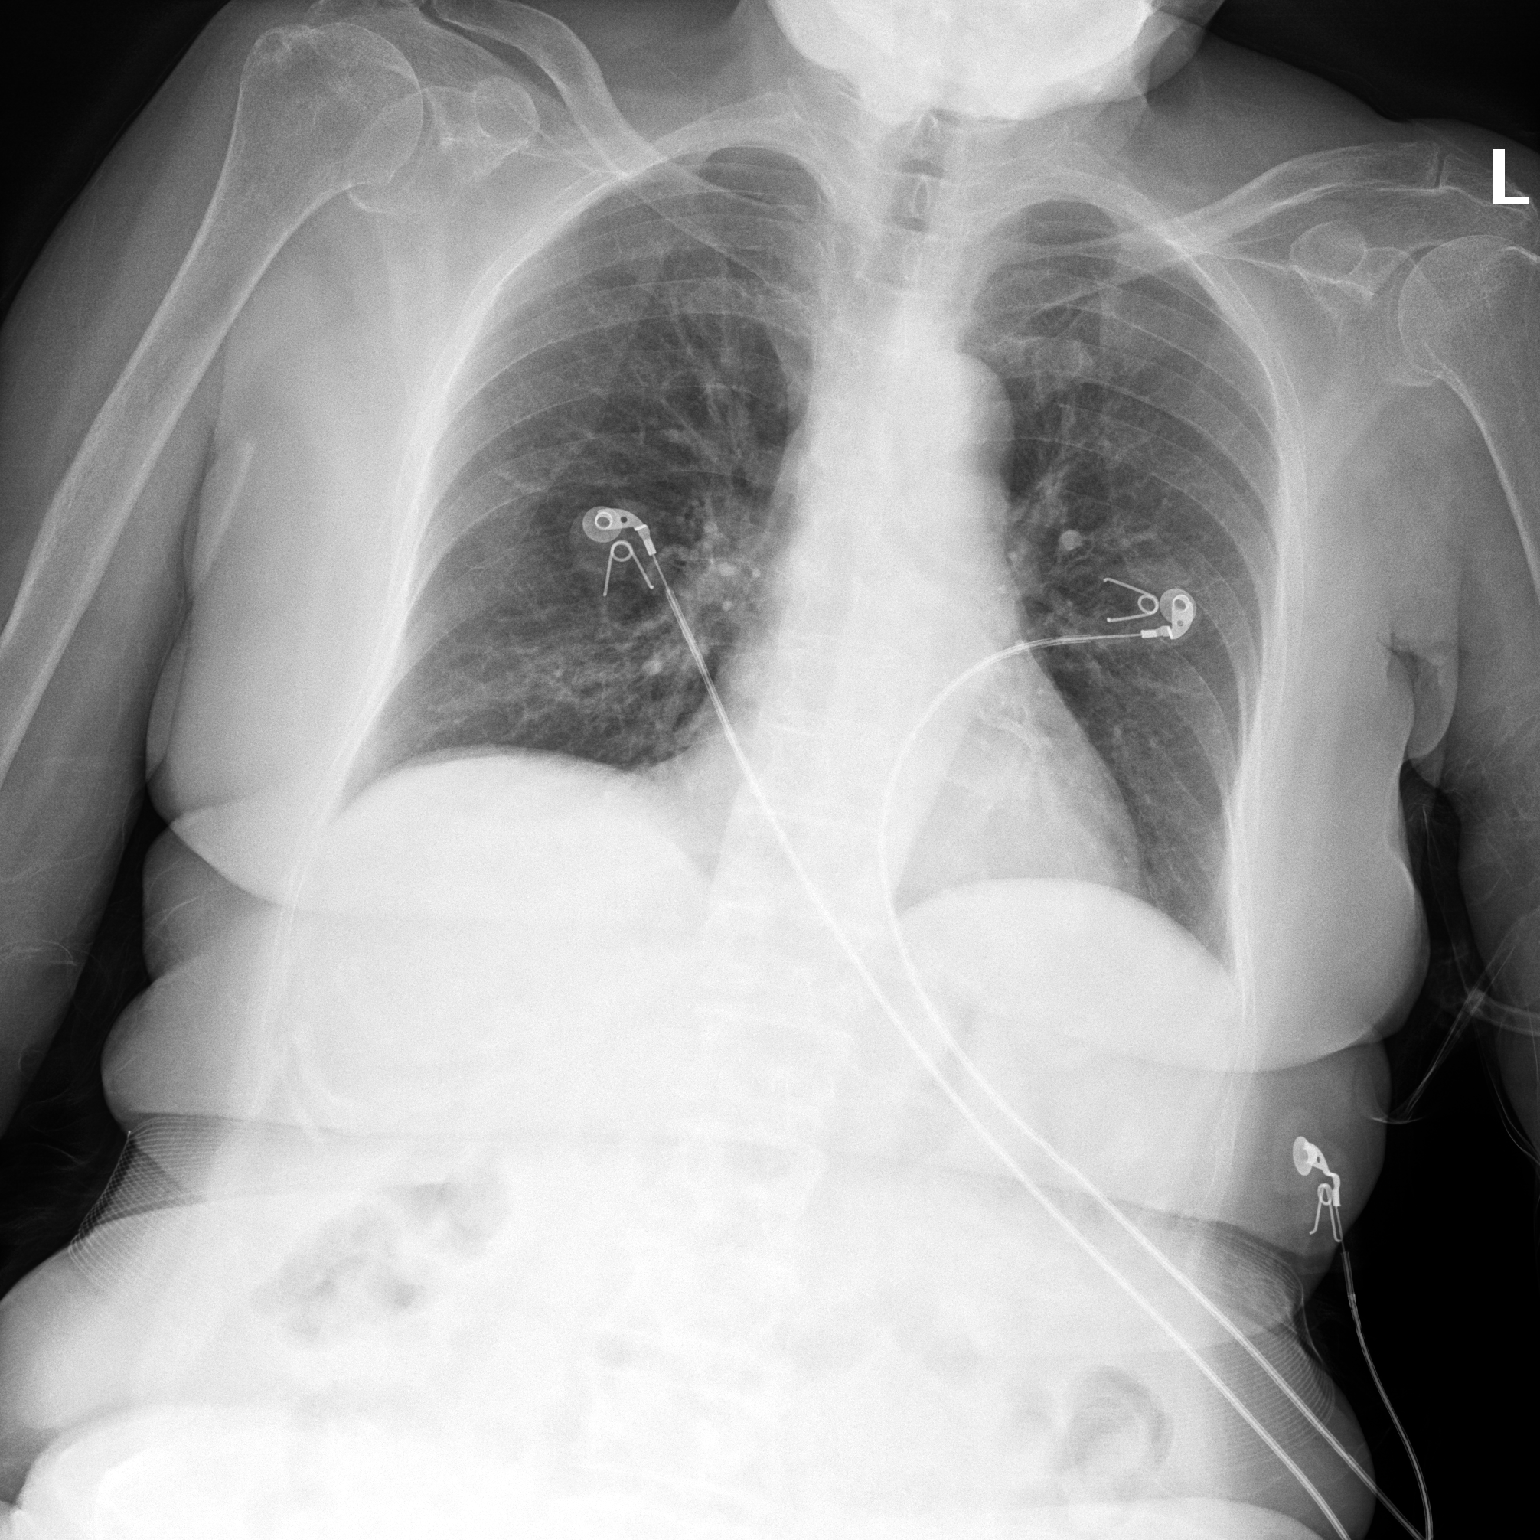

[1 of 1 positions shown; findings below may reference images not displayed]

FINDINGS: Cardiomediastinal contour is stable. No aggressive bone lesions are seen. Lungs are symmetrically aerated and clear.
IMPRESSION: 
IMPRESSION: No interval change or acute findings

## 2022-02-01 IMAGING — CT CT HEAD WITHOUT CONTRAST
2 of 3 series · 15 of 40 positions shown, 18 images · non-contrast
Comparison: 02/10/2019

AMS COVID POSITIVE
HX STROKE
FINAL REPORT:
INDICATION: Altered mental status, nontraumatic (Ped 0-18y) Pain
Exam: CT of the head without IV Contrast
TECHNIQUE: Multiplanar unenhanced CT images were obtained through the brain.
All CT scans at this facility use iterative reconstruction technique, dose modulation and/or weight based dosing when appropriate to reduce radiation dose to as low as reasonably achievable.

[Series 2: head stnd · axial · 0.49mm/px · z∈[+21,+153]mm · 12 of 32 slices shown, 15 images]
[im 3/32  brain]
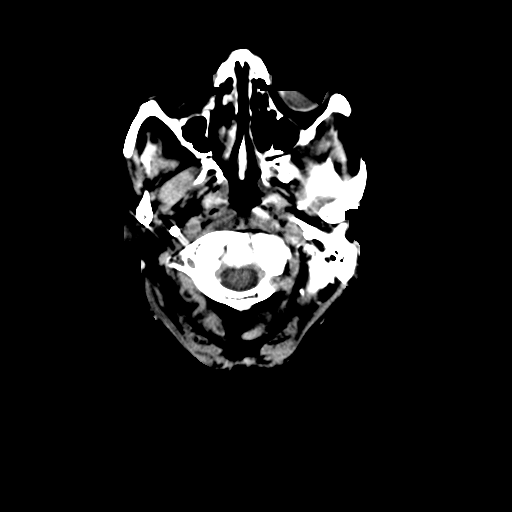
[im 3/32  bone]
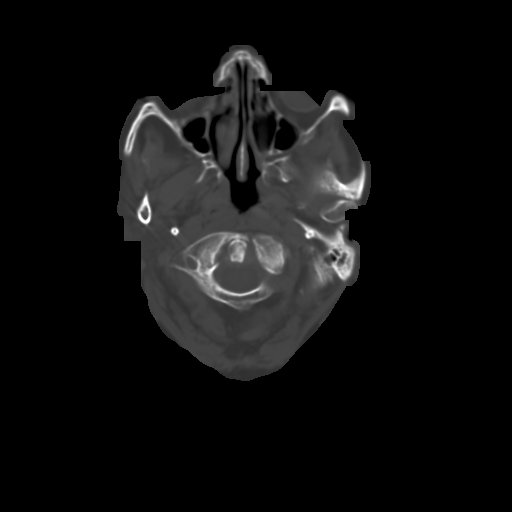
[im 5/32  brain]
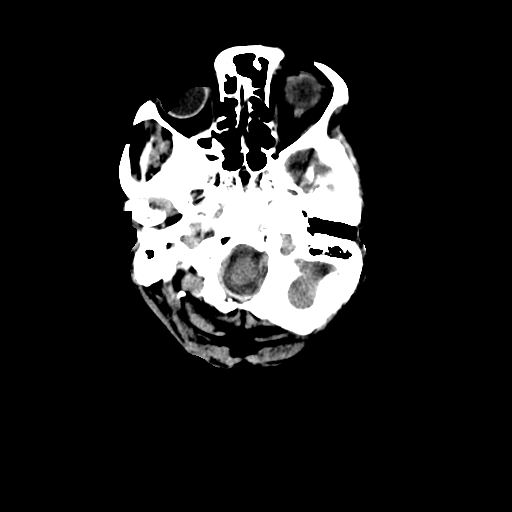
[im 7/32  brain]
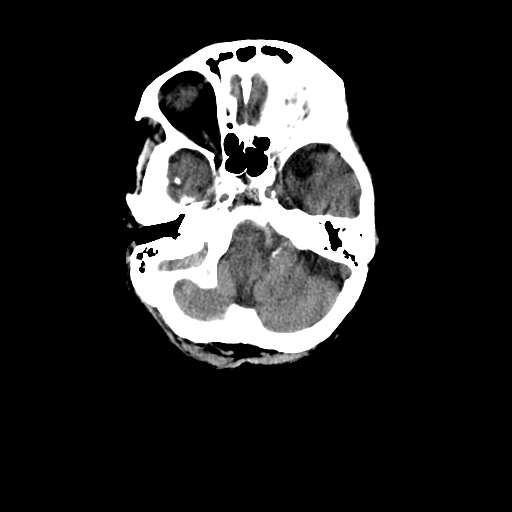
[im 10/32  brain]
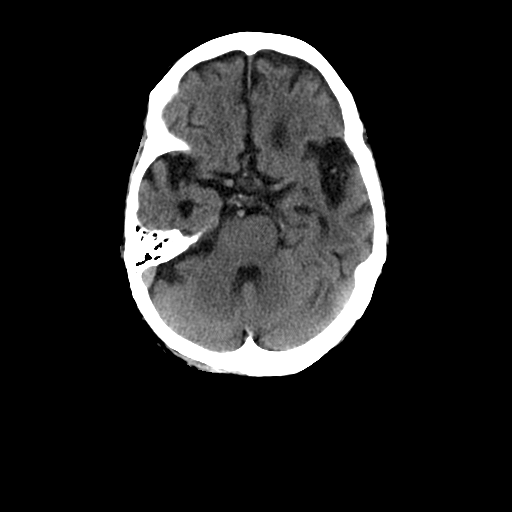
[im 12/32  brain]
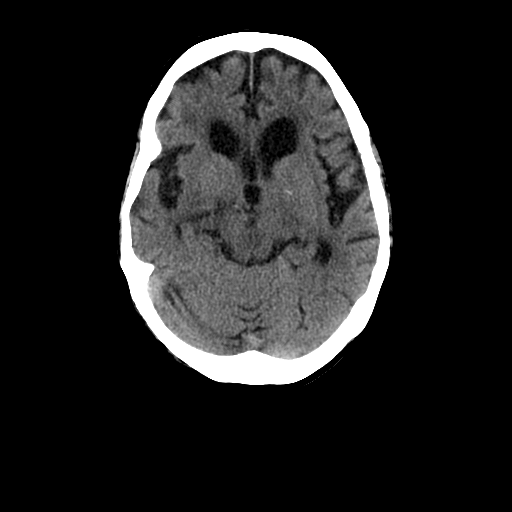
[im 12/32  bone]
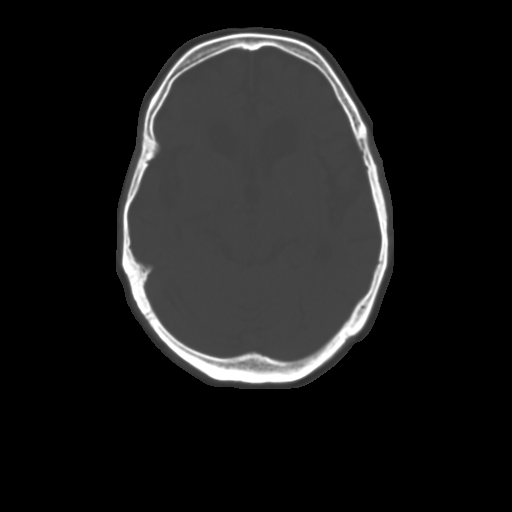
[im 14/32  brain]
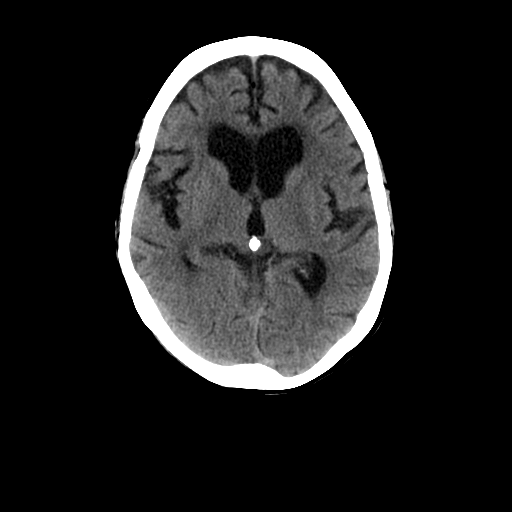
[im 18/32  brain]
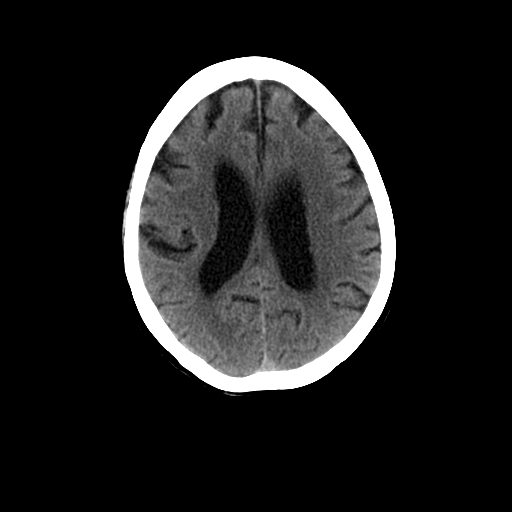
[im 20/32  brain]
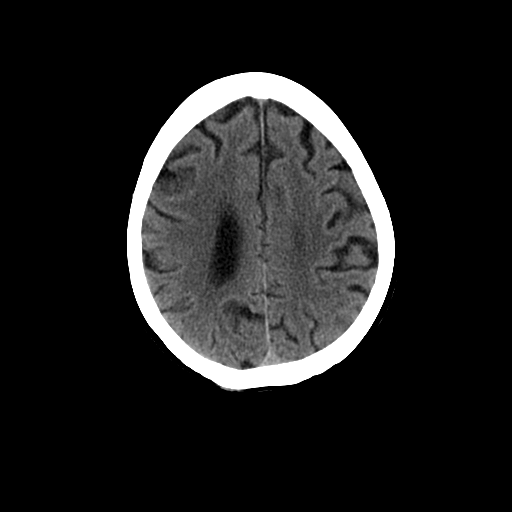
[im 22/32  brain]
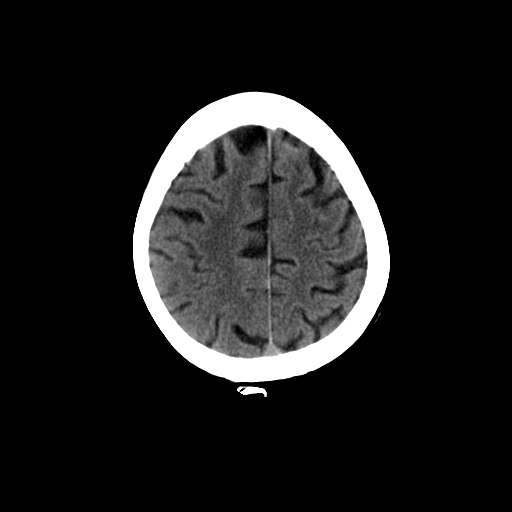
[im 22/32  bone]
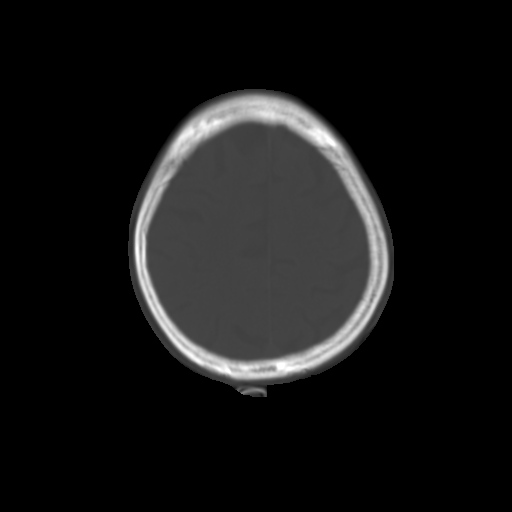
[im 25/32  brain]
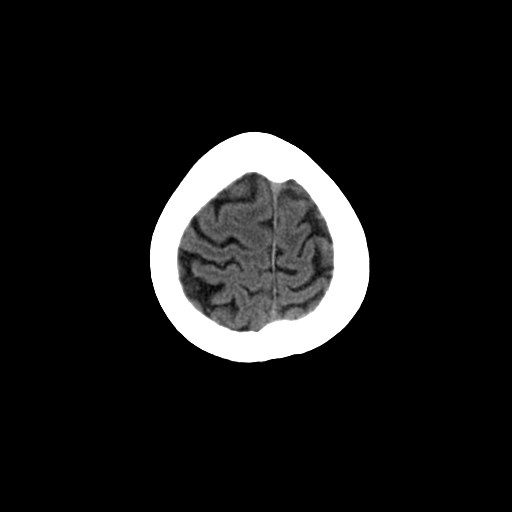
[im 27/32  brain]
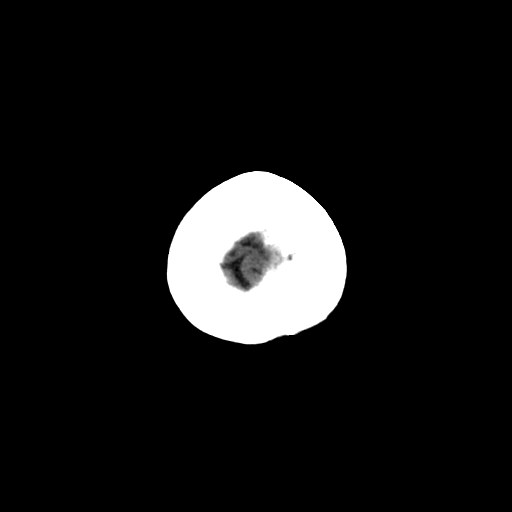
[im 29/32  brain]
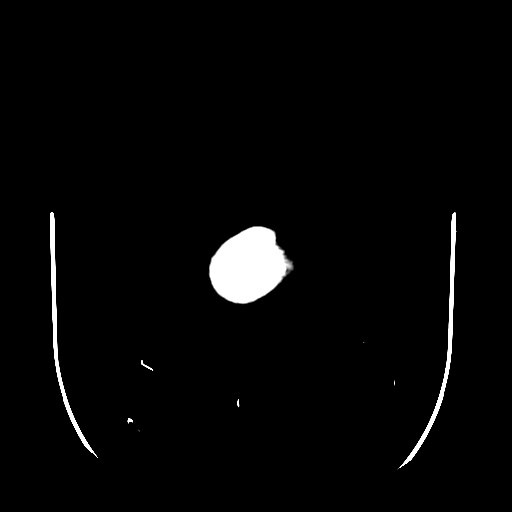

[Series 601: cor head · coronal · 0.49mm/px · 3 of 97 slices shown]
[im 25/97  brain]
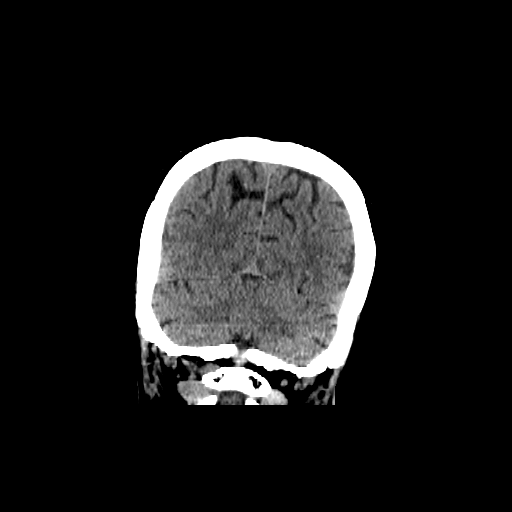
[im 49/97  brain]
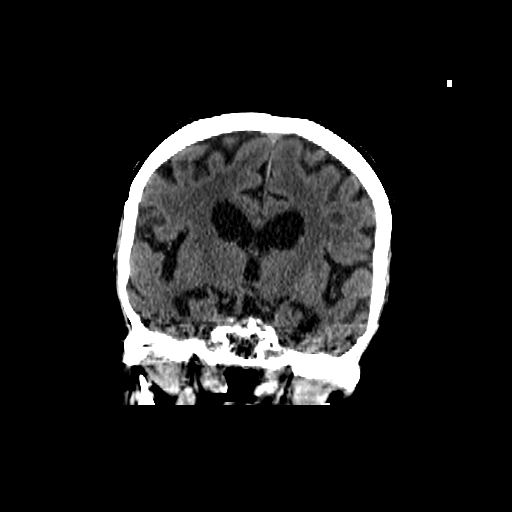
[im 73/97  brain]
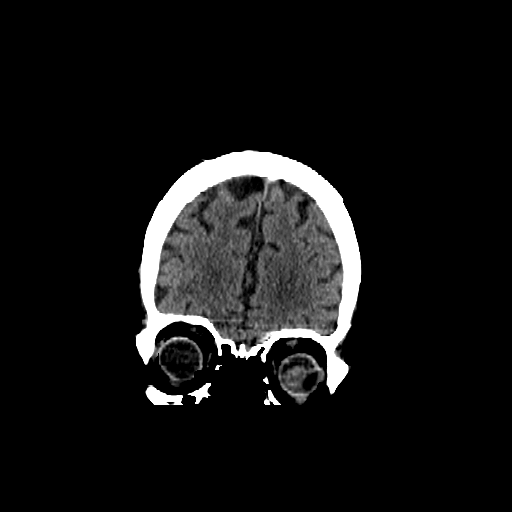

[15 of 40 positions shown; findings below may reference images not displayed]

FINDINGS: The ventricles are enlarged but are similar to the prior likely ex vacuo. There is no intracranial mass or mass effect. No intra-axial or extra-axial fluid collection. White matter hypodensities are mild-to-moderate in keeping with chronic microangiopathic change. These are stable. The gray and white matter are otherwise normal in density and differentiation. No aggressive bone lesions are seen.
IMPRESSION: 
IMPRESSION: Chronic changes with no acute intracranial findings

## 2022-02-01 IMAGING — MR MRI BRAIN WITHOUT CONTRAST
11 series · 48 of 48 positions shown · non-contrast
Comparison: None available

FINAL REPORT:
INDICATION: Mental status change, unknown cause Pain
Exam: MRI of the head without IV Contrast

[Series 2: survey_mpr_sag · oblique · 1.6mm · 1.60mm/px · 1 of 5 slices shown]
[im 1/5]
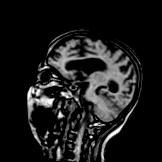

[Series 3: survey_mpr_cor · coronal · 1.6mm · 1.60mm/px · 1 of 3 slices shown]
[im 1/3]
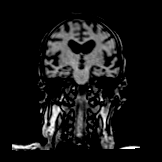

[Series 4: survey_mpr_tra · axial · 1.6mm · 1.60mm/px · 1 of 3 slices shown]
[im 1/3]
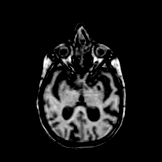

[Series 5: T1 · sagittal · 5.0mm · 0.45mm/px · 5 of 27 slices shown (1 of 2)]
[im 1/27]
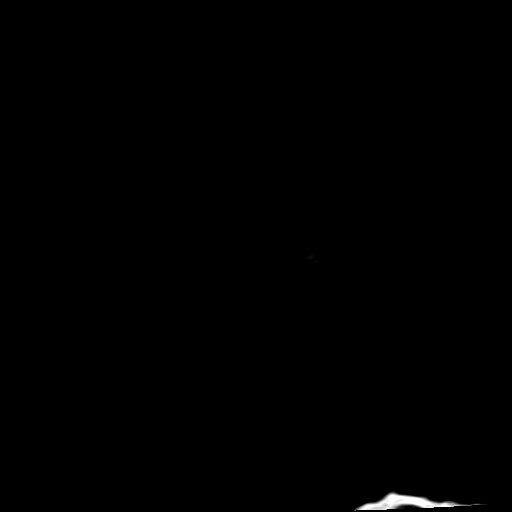
[im 7/27]
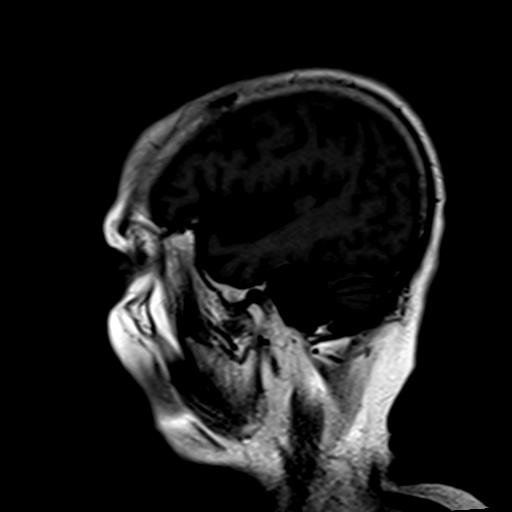
[im 14/27]
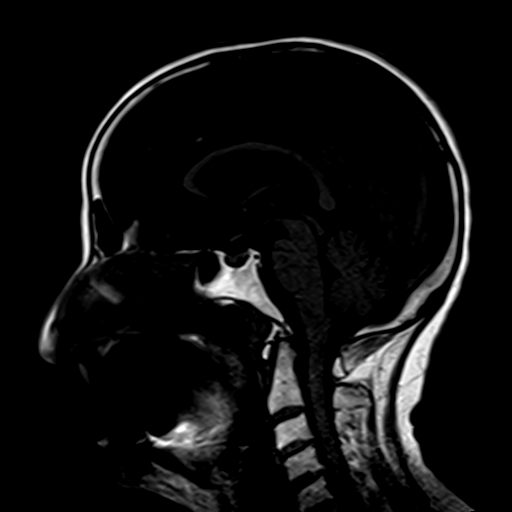
[im 20/27]
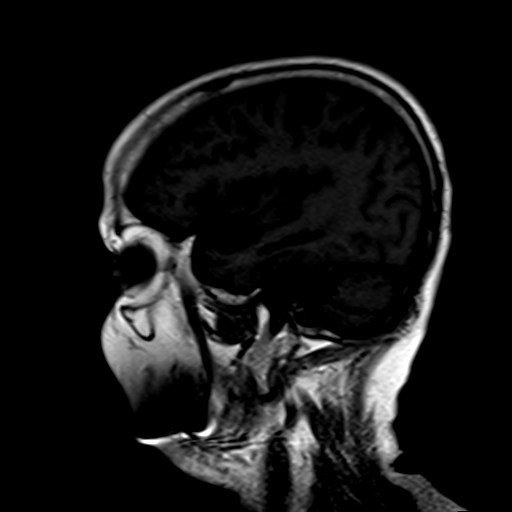
[im 27/27]
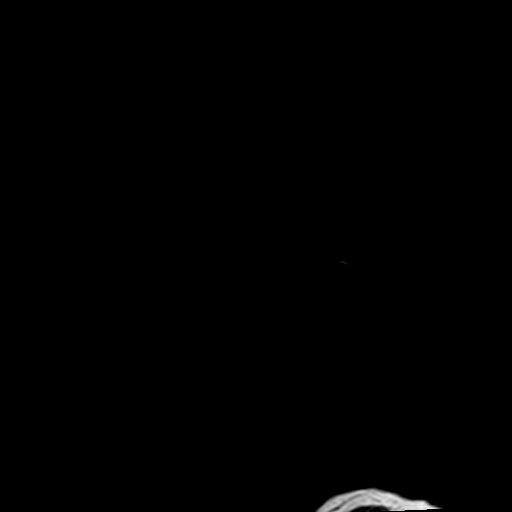

[Series 6: ax dwi_tracew · axial · 5.0mm · 0.87mm/px · z∈[-63,+81]mm · 10 of 50 slices shown]
[im 1/50]
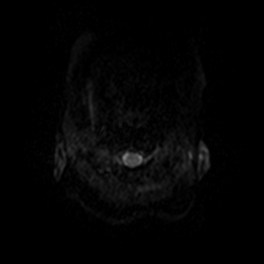
[im 6/50]
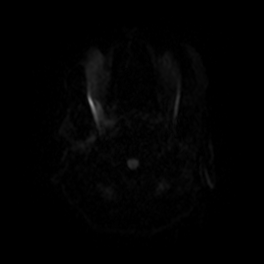
[im 11/50]
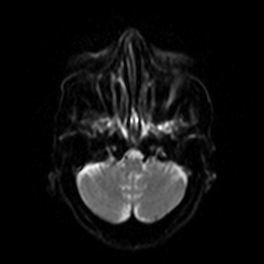
[im 17/50]
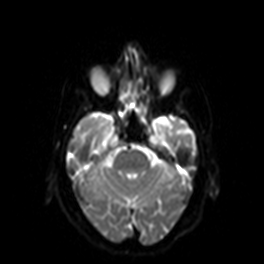
[im 22/50]
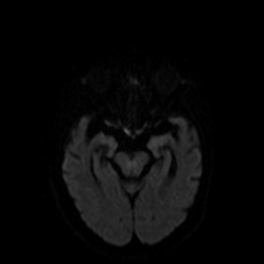
[im 28/50]
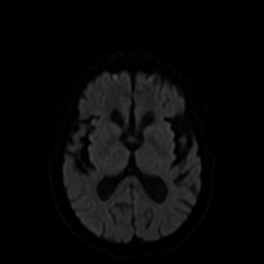
[im 33/50]
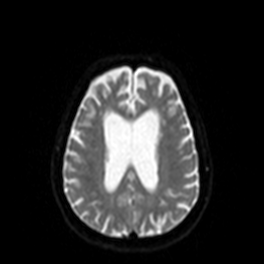
[im 39/50]
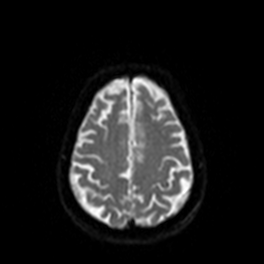
[im 44/50]
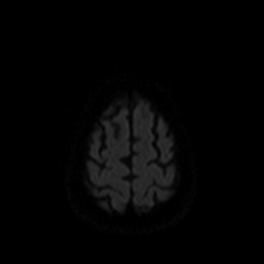
[im 50/50]
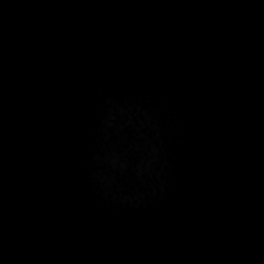

[Series 7: ax dwi_adc · axial · 5.0mm · 0.87mm/px · z∈[-63,+81]mm · 5 of 25 slices shown]
[im 1/25]
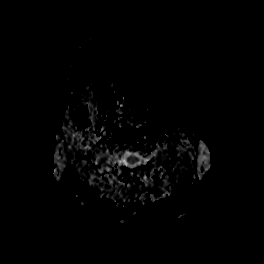
[im 7/25]
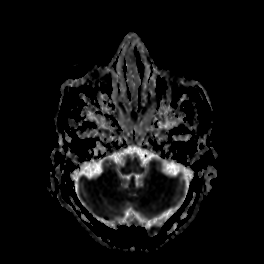
[im 13/25]
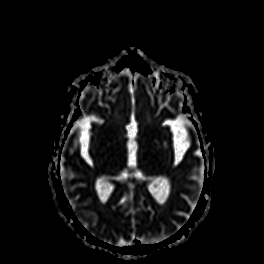
[im 19/25]
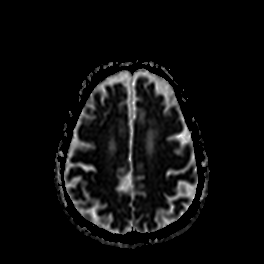
[im 25/25]
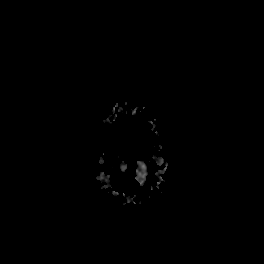

[Series 8: ax dwi_exp · axial · 5.0mm · 0.87mm/px · z∈[-63,+81]mm · 5 of 25 slices shown]
[im 1/25]
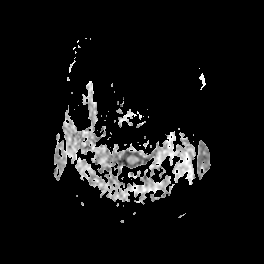
[im 7/25]
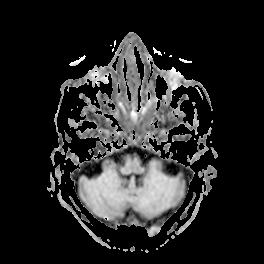
[im 13/25]
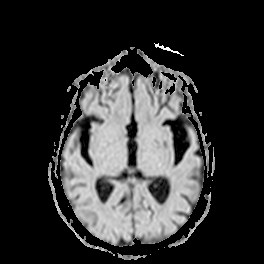
[im 19/25]
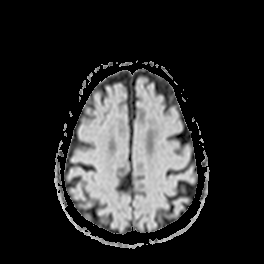
[im 25/25]
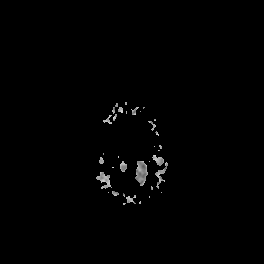

[Series 9: T2 · axial · 5.0mm · 0.90mm/px · z∈[-63,+80]mm · 5 of 25 slices shown]
[im 1/25]
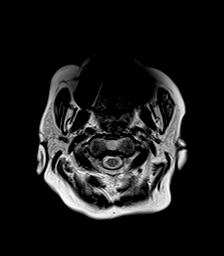
[im 7/25]
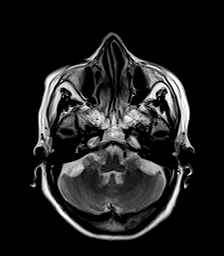
[im 13/25]
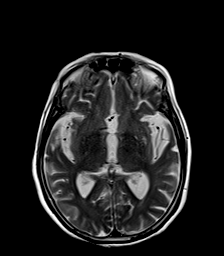
[im 19/25]
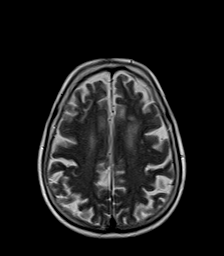
[im 25/25]
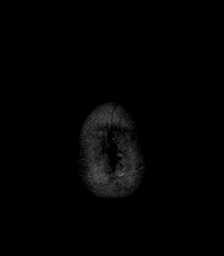

[Series 10: FLAIR · axial · 5.0mm · 0.90mm/px · z∈[-63,+80]mm · 5 of 25 slices shown]
[im 1/25]
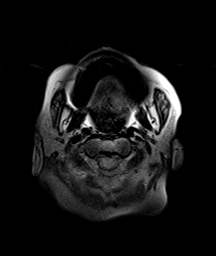
[im 7/25]
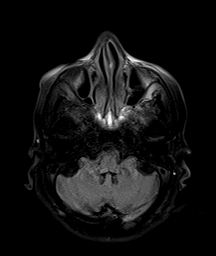
[im 13/25]
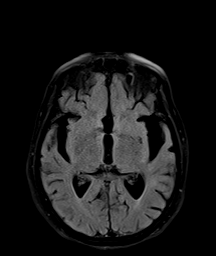
[im 19/25]
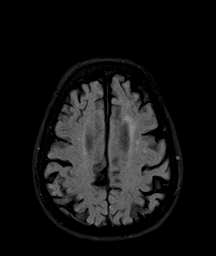
[im 25/25]
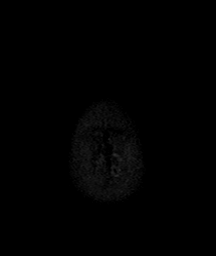

[Series 11: T1 · axial · 5.0mm · 0.45mm/px · z∈[-63,+80]mm · 5 of 25 slices shown (2 of 2)]
[im 1/25]
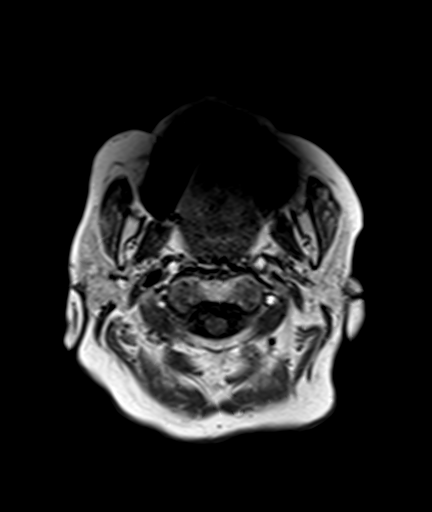
[im 7/25]
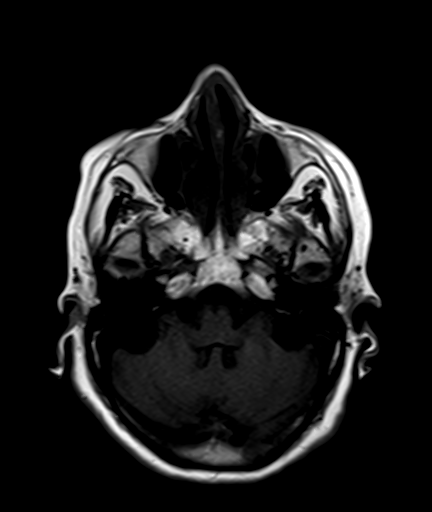
[im 13/25]
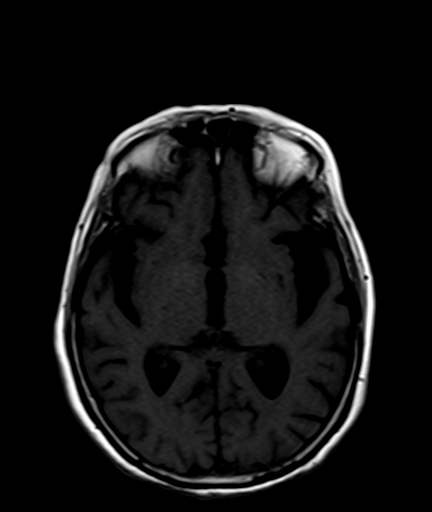
[im 19/25]
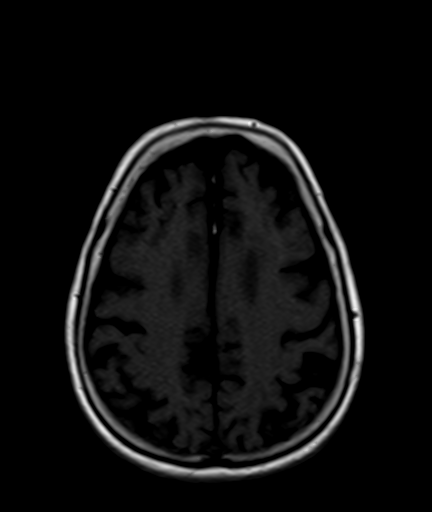
[im 25/25]
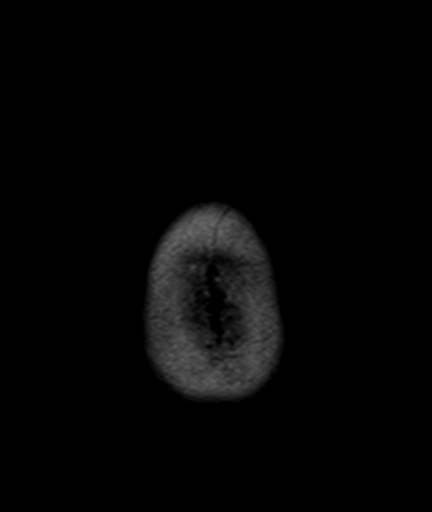

[Series 12: GRE · axial · 5.0mm · 1.20mm/px · z∈[-64,+80]mm · 5 of 25 slices shown]
[im 1/25]
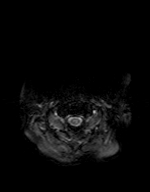
[im 7/25]
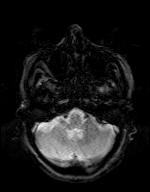
[im 13/25]
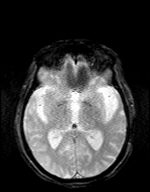
[im 19/25]
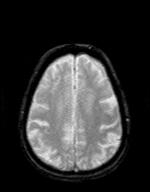
[im 25/25]
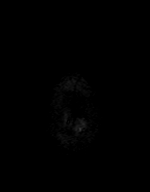

[48 of 48 positions shown; findings below may reference images not displayed]

FINDINGS: Extra-axial spaces: Unremarkable
Intracranial hemorrhage: None.
Ventricular system: There is significant generalized volume loss with ex vacuo dilatation of the ventricles.
Basal cisterns: Patent
Cerebral parenchyma: Nonspecific T2/Flair hyperintensities are present within the periventricular and subcortical white matter in both cerebral hemispheres.
Midline shift: None.
Vascular system: Normal.
Paranasal sinuses and mastoid air cells: Clear.
Visualized orbits: Unremarkable.
Sella: Unremarkable.
Calvarium: Normal.
IMPRESSION: 
IMPRESSION: 1. Chronic small vessel ischemic changes without acute intracranial abnormality.

## 2022-02-22 IMAGING — CR Shoulder R
3 series · 3 of 3 positions shown · non-contrast
Comparison: None available.

FINAL REPORT:
XR SHOULDER 2 VIEW RIGHT
INDICATION: Pain in right shoulder

[AP (1 of 2)]
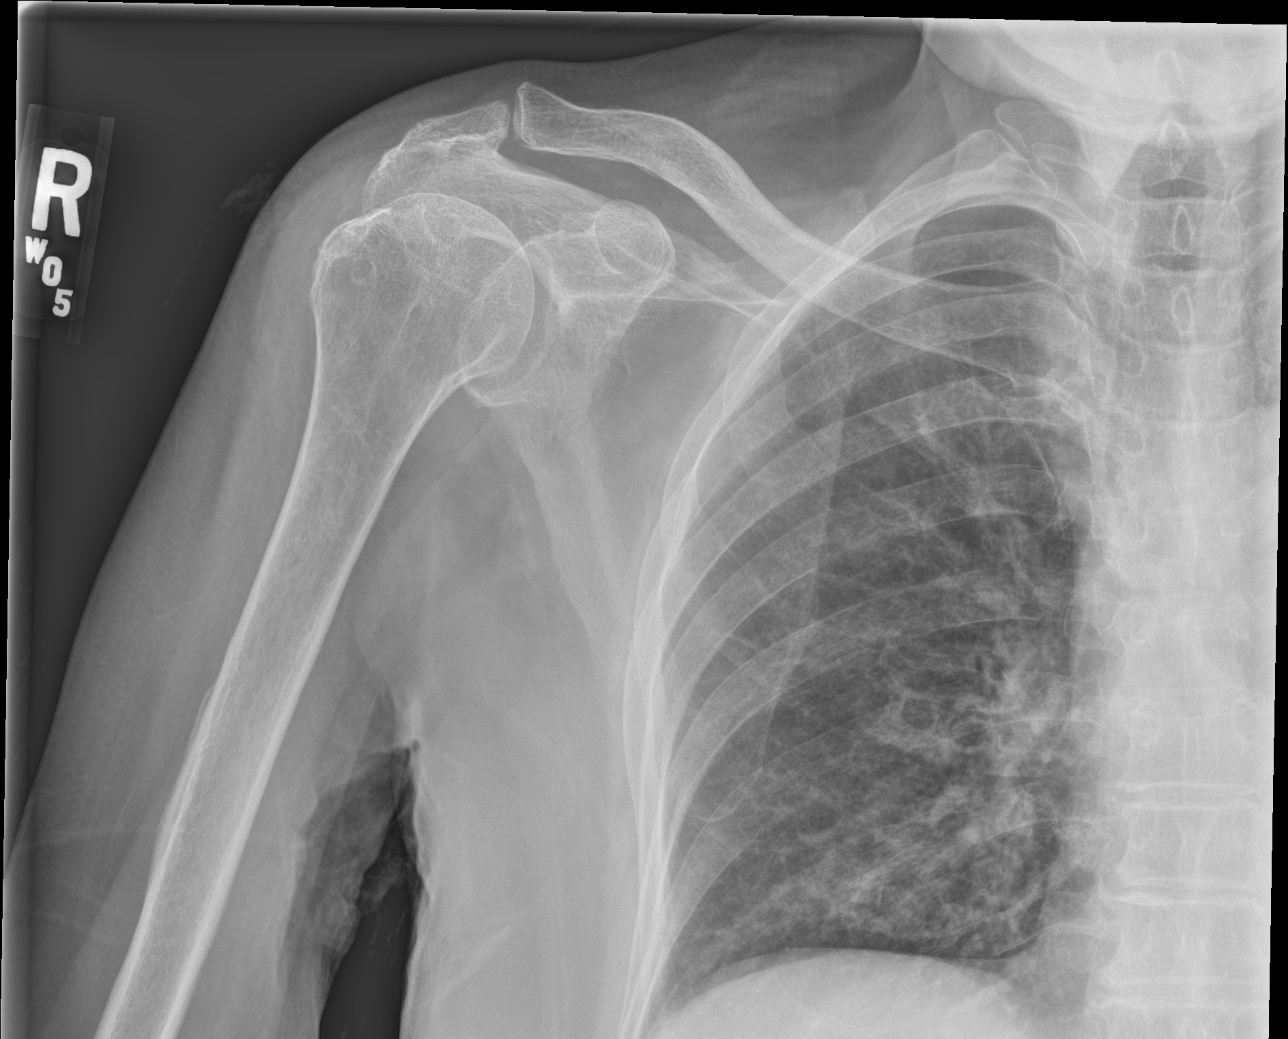

[AP (2 of 2)]
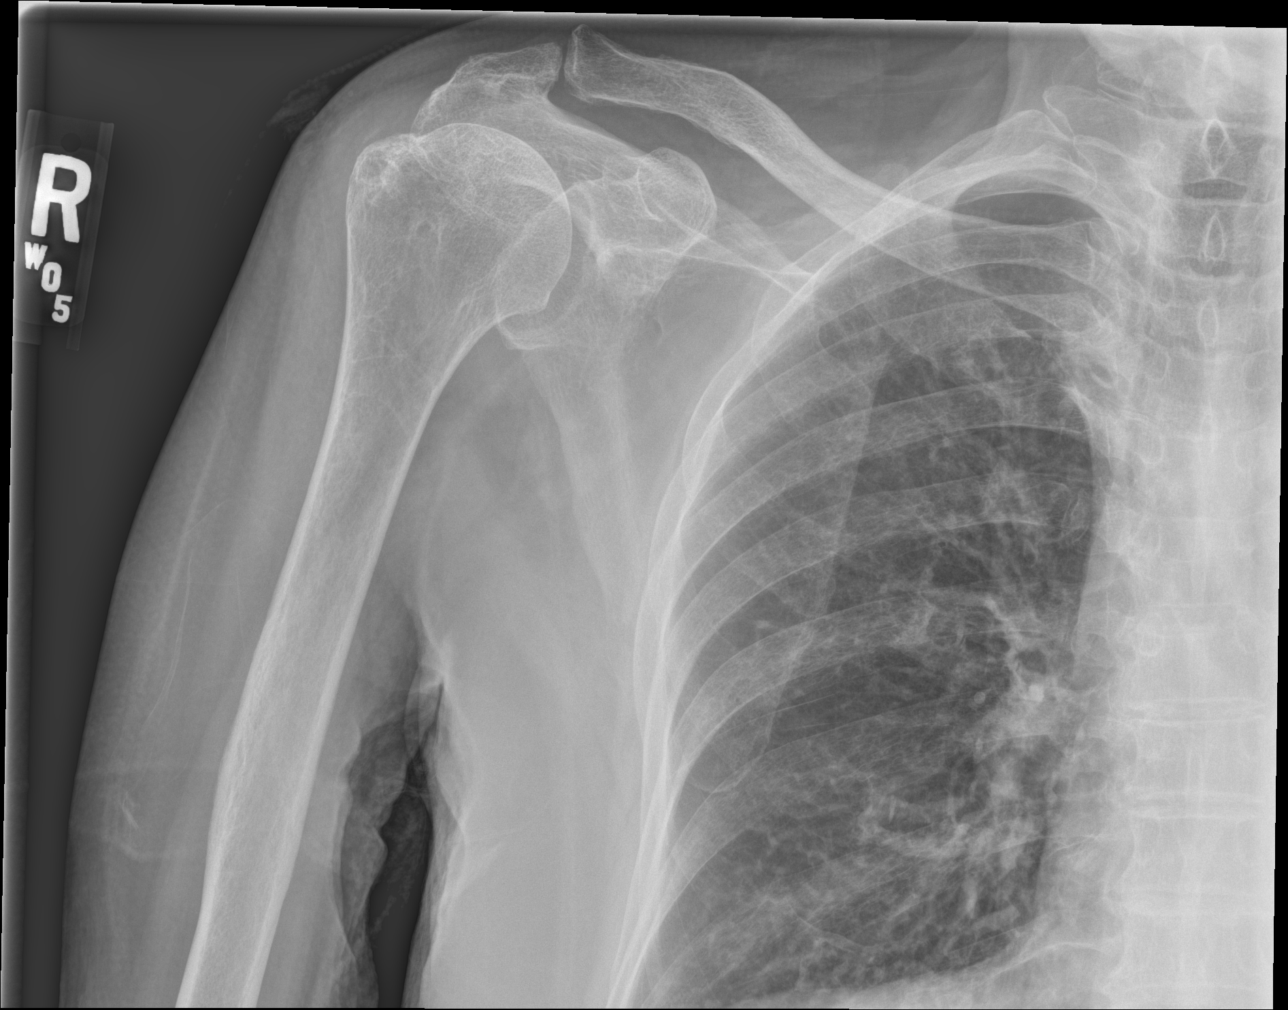

[right lateral]
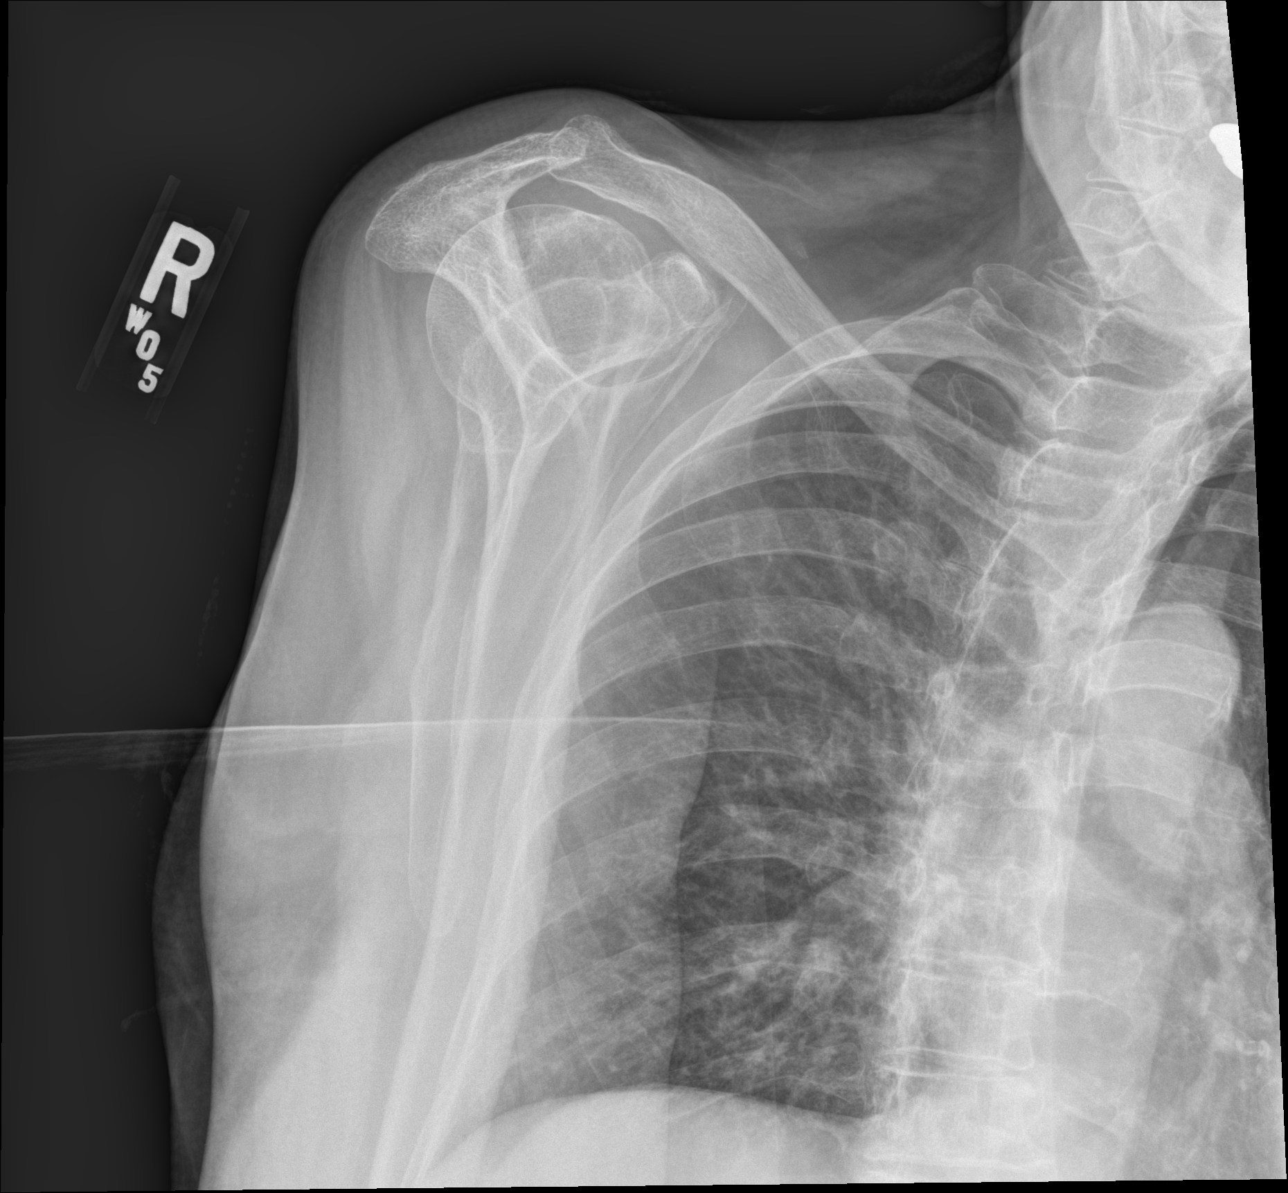

[3 of 3 positions shown; findings below may reference images not displayed]

FINDINGS: 3 views were obtained.
No acute fracture or malalignment. Joint spaces are maintained. No soft tissue swelling, radiopaque foreign body or gas. The shoulder is high riding.
IMPRESSION: Degenerative changes with high riding humeral head suggestive of rotator cuff injury.

## 2022-03-01 IMAGING — DX XR CHEST 2 VIEWS
1 series · 2 of 2 positions shown · non-contrast
Comparison: None
The lungs are clear.

FINAL REPORT:
Chest 2 views:
CLINICAL INDICATION: Acute bronchitis

[Series 3952: PA · U · 0.19mm/px · 2 of 2 slices shown]
[im 1/2]
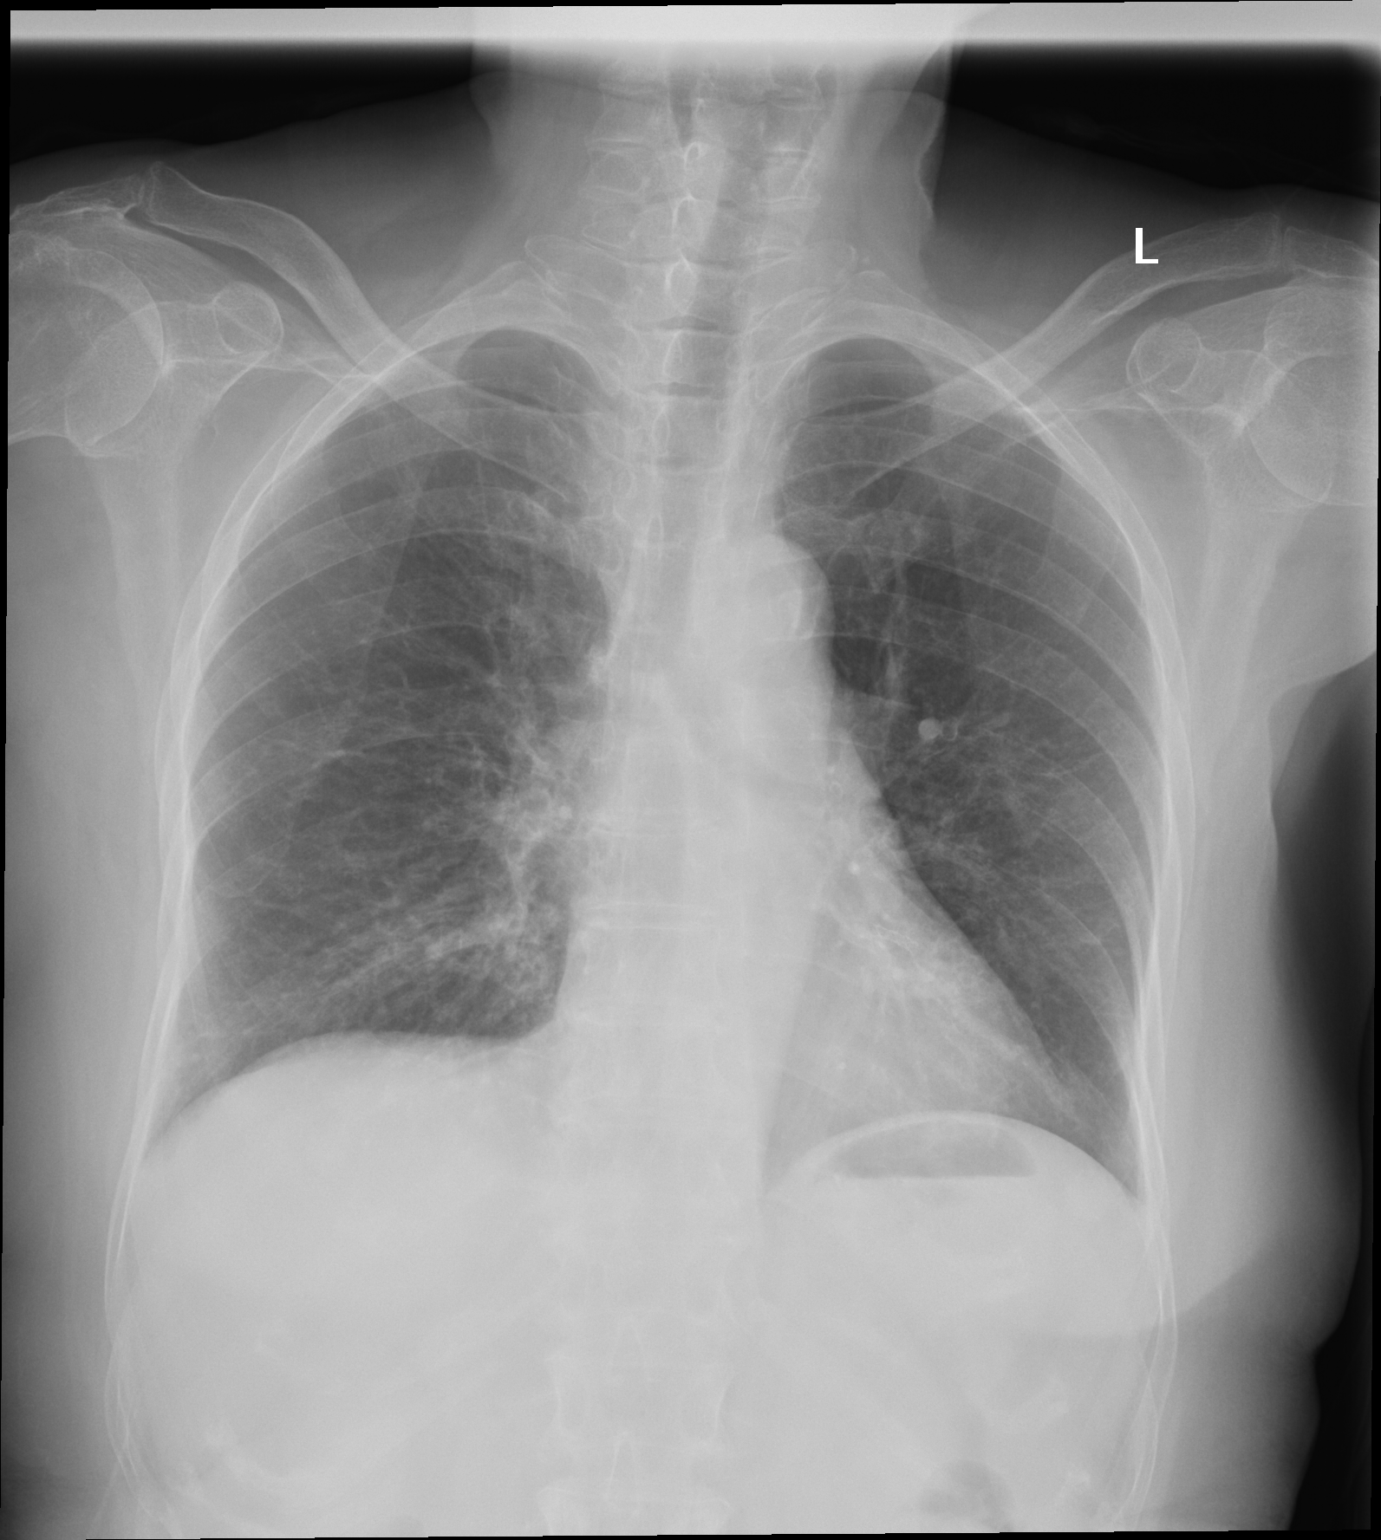
[im 2/2]
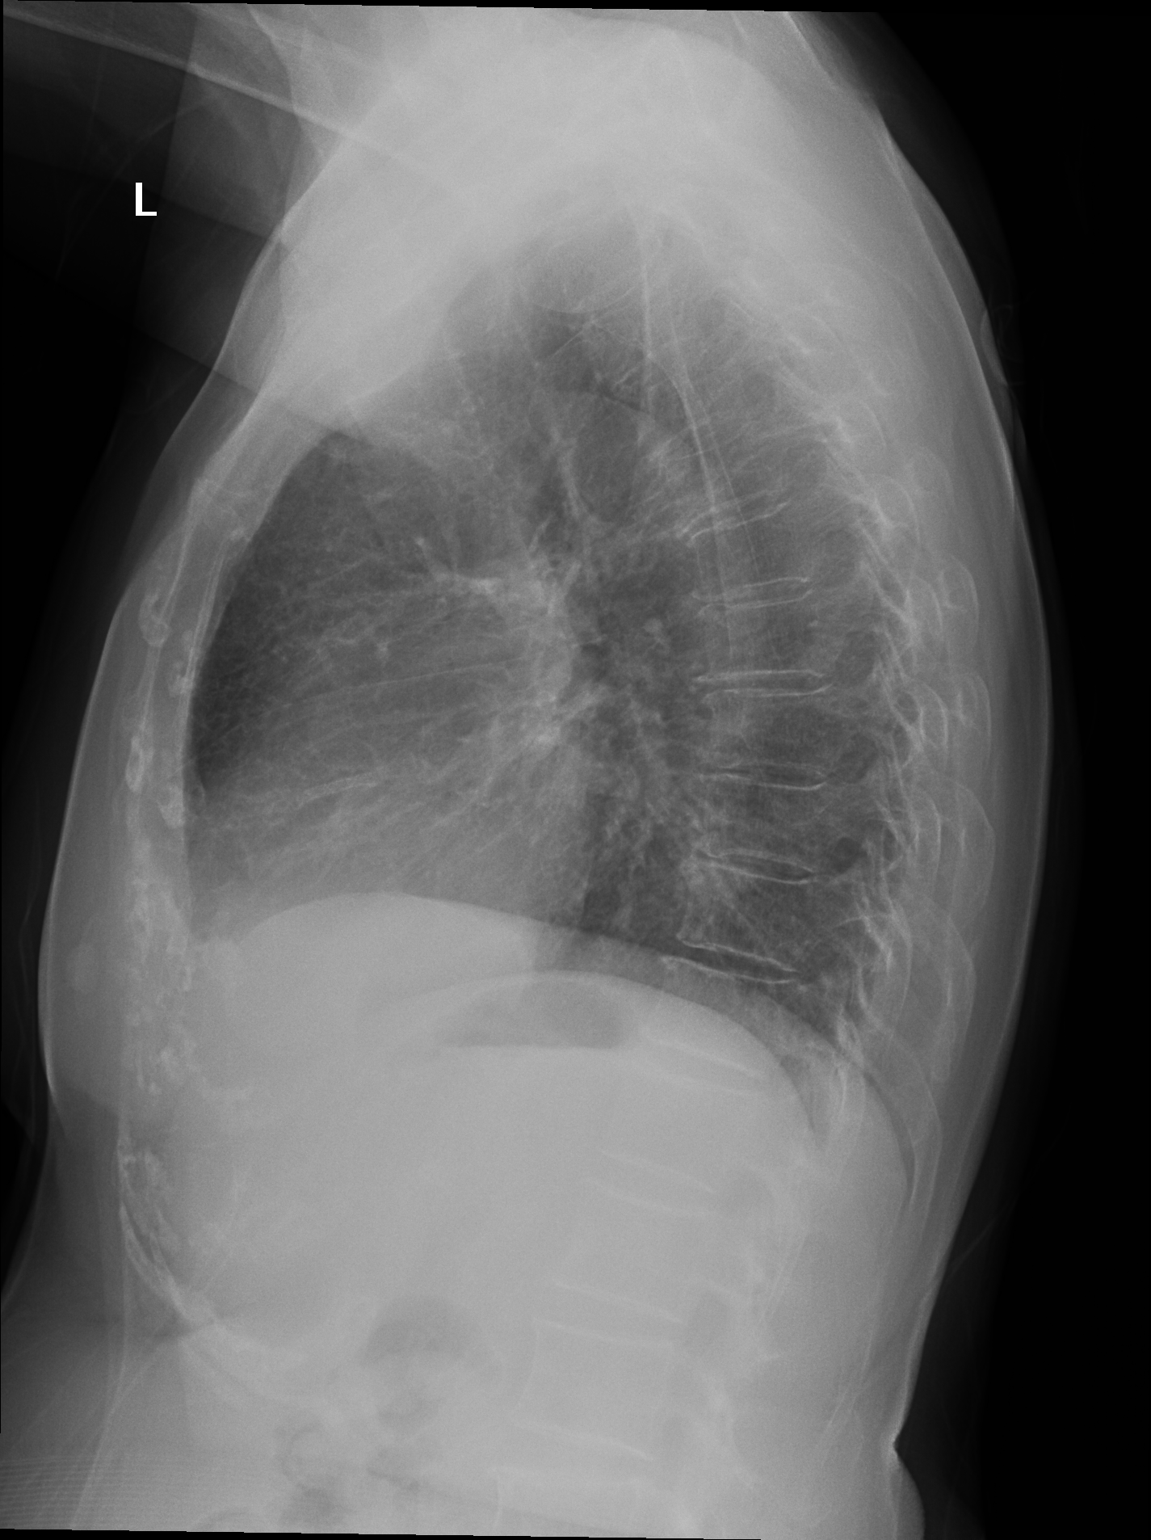

[2 of 2 positions shown; findings below may reference images not displayed]

There is no vascular congestion, pneumothorax, or effusion. The heart appears of normal size. The skeletal structures appear intact.
IMPRESSION: No acute intrathoracic process or significant change.

## 2024-05-24 IMAGING — MR MRI FEMUR LEFT WITHOUT IV CONTRAST
7 series · 32 of 40 positions shown · non-contrast
Comparison: None available

FINAL REPORT:
HISTORY: Procedure: MRI of the knee left femur without contrast
TECHNIQUE: Multisequence, multiplanar imaging of the left femur was performed.

[Series 2: survey · axial · left · 8.0mm · 0.88mm/px · z∈[-134,+243]mm · 4 of 23 slices shown]
[im 1/23]
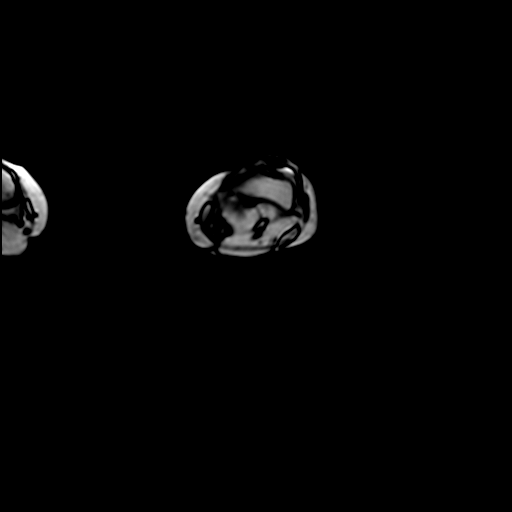
[im 8/23]
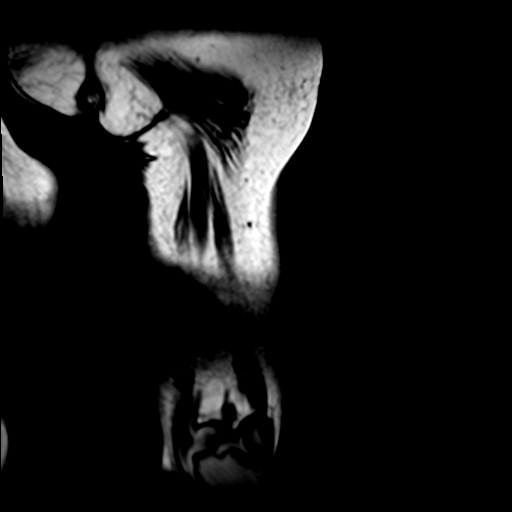
[im 15/23]
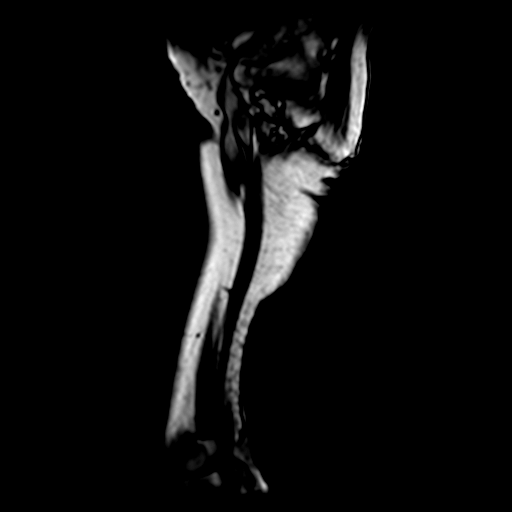
[im 23/23]
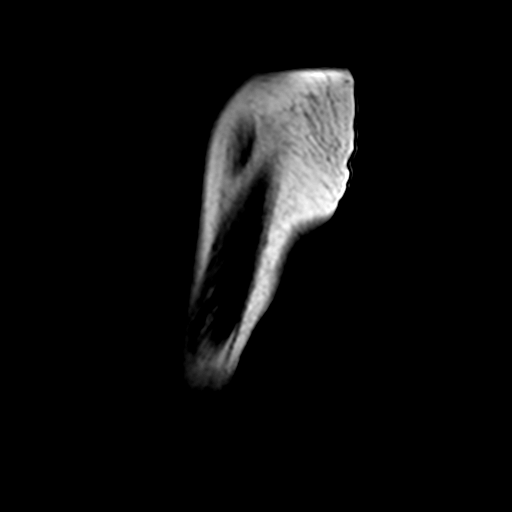

[Series 3: T1 · coronal · left · 4.0mm · 0.85mm/px · 3 of 21 slices shown (1 of 2)]
[im 1/21]
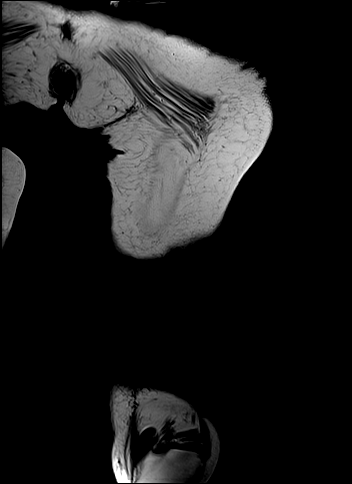
[im 11/21]
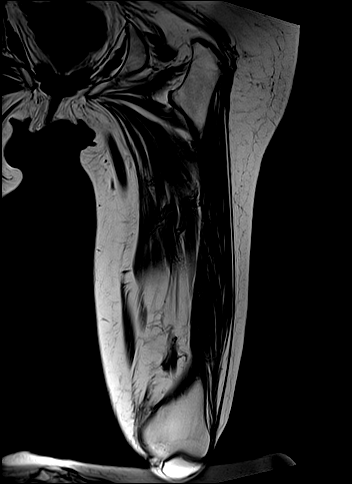
[im 21/21]
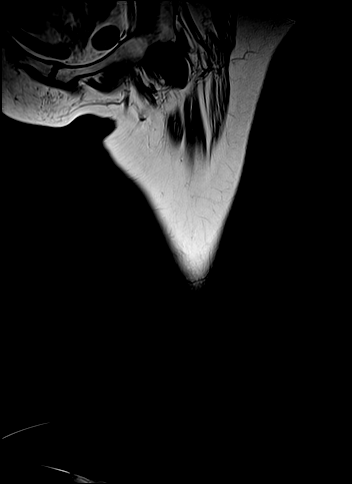

[Series 4: STIR · coronal · left · 4.0mm · 0.99mm/px · 3 of 21 slices shown (1 of 2)]
[im 1/21]
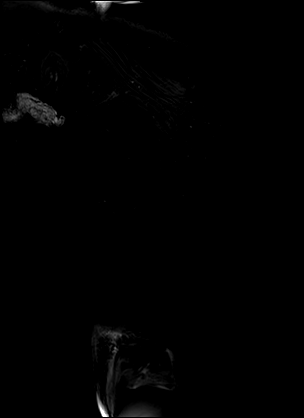
[im 11/21]
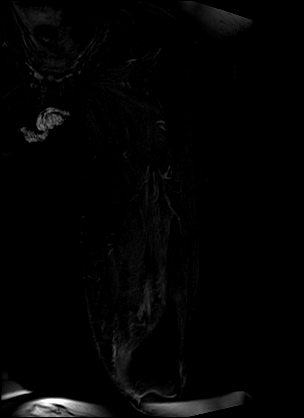
[im 21/21]
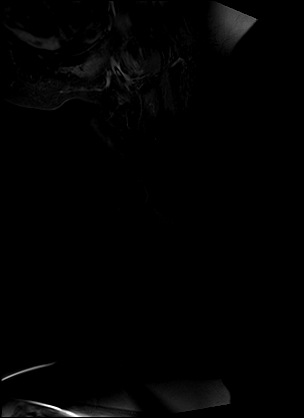

[Series 5: T1 · sagittal · left · 4.0mm · 0.85mm/px · 3 of 21 slices shown (2 of 2)]
[im 1/21]
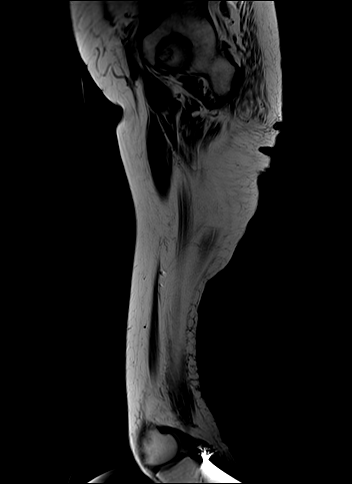
[im 11/21]
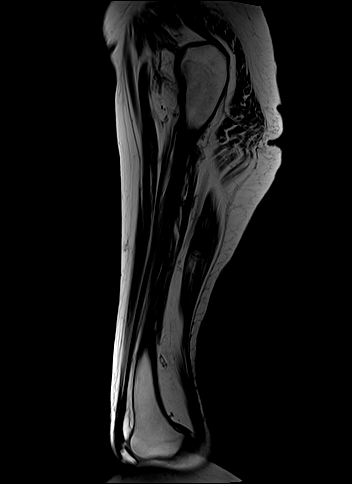
[im 21/21]
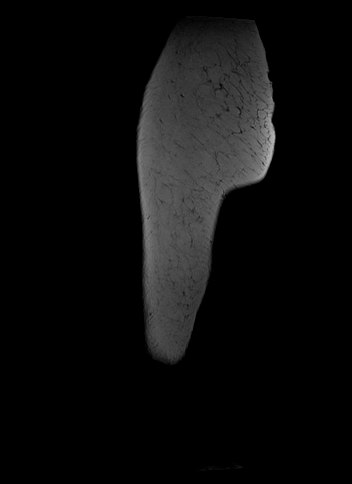

[Series 6: STIR · sagittal · left · 4.0mm · 0.85mm/px · 3 of 21 slices shown (2 of 2)]
[im 1/21]
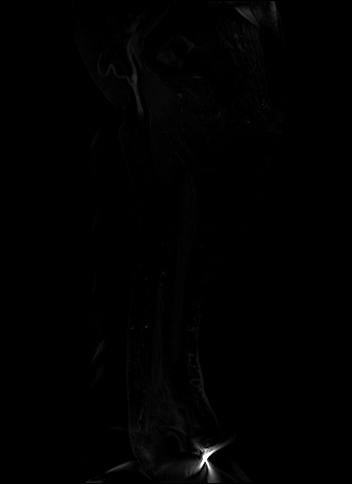
[im 11/21]
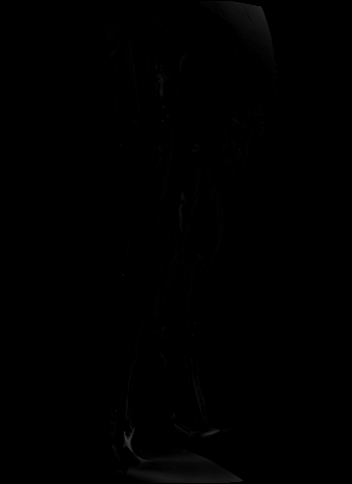
[im 21/21]
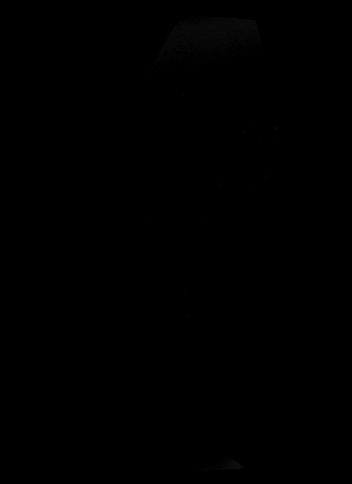

[Series 9: ax t1_comp · axial · left · 5.0mm · 0.55mm/px · z∈[-171,+226]mm · 8 of 74 slices shown]
[im 1/74]
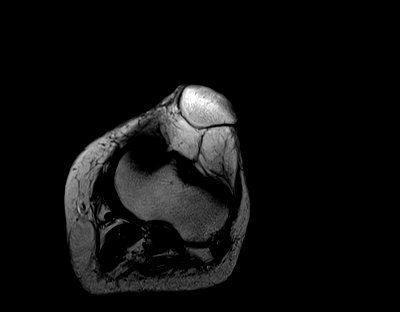
[im 14/74]
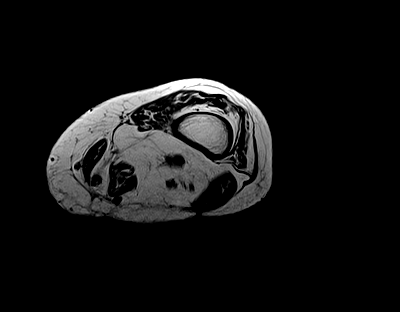
[im 20/74]
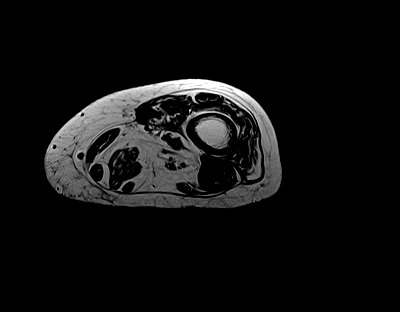
[im 34/74]
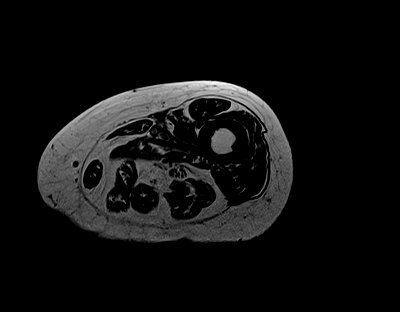
[im 40/74]
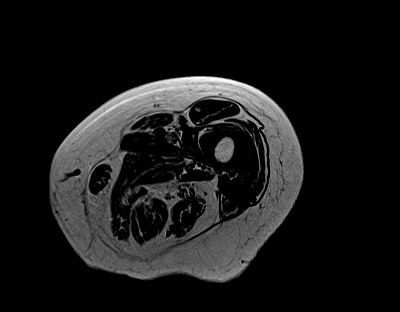
[im 54/74]
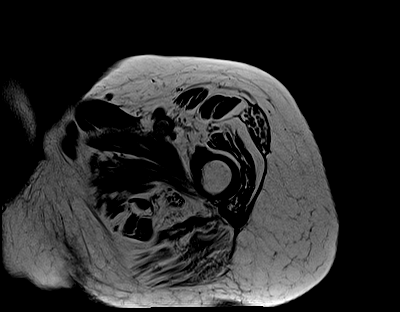
[im 60/74]
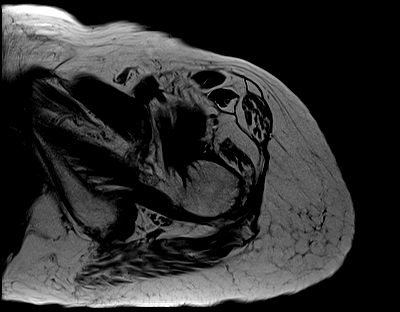
[im 74/74]
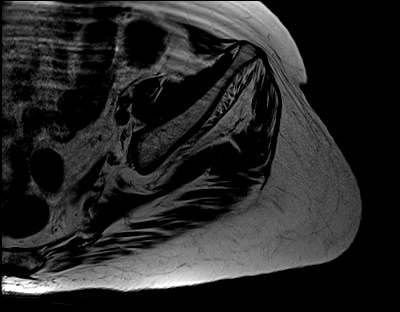

[Series 15: ax stir_comp_filt · axial · left · 5.0mm · 0.55mm/px · z∈[-171,+226]mm · 8 of 74 slices shown]
[im 1/74]
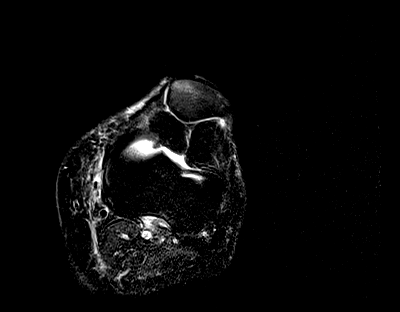
[im 14/74]
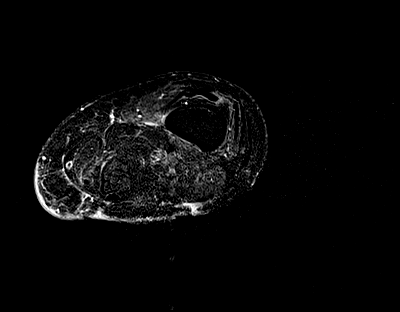
[im 20/74]
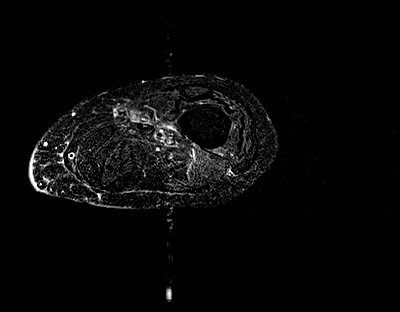
[im 34/74]
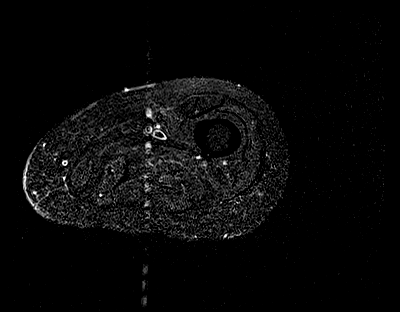
[im 40/74]
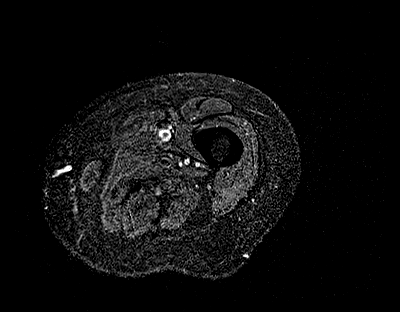
[im 54/74]
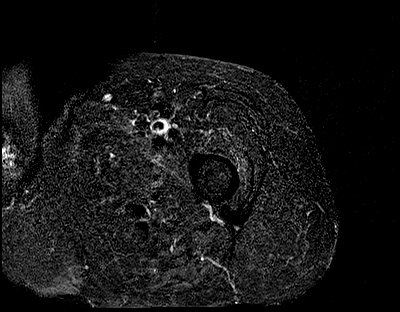
[im 60/74]
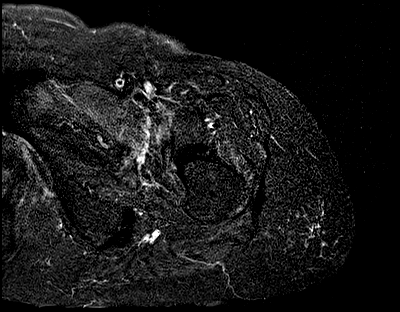
[im 74/74]
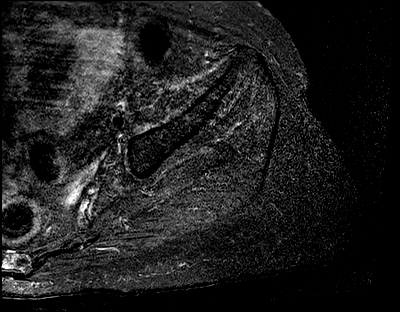

[32 of 40 positions shown; findings below may reference images not displayed]

FINDINGS: Nonspecific soft tissue edema in the medial distal thigh at the level of the femoral condyles. Soft tissues are otherwise unremarkable. No fluid collection. No evidence of mass. No fracture. No periosteal reaction. No bone destruction. Course of the sciatic nerve is unremarkable.
IMPRESSION: 
IMPRESSION: 1.  No acute bony findings.
2.  Nonspecific soft tissue edema in the medial distal thigh. This is at the level of the knee, if pain in this area consider dedicated knee MRI
